# Patient Record
Sex: Female | Born: 1935 | Race: White | Hispanic: No
Health system: Southern US, Community
[De-identification: ages and names within clinical notes are randomized; demographics above are authoritative.]

---

## 1997-10-04 ENCOUNTER — Ambulatory Visit (HOSPITAL_COMMUNITY): Admission: RE | Admit: 1997-10-04 | Discharge: 1997-10-04 | Payer: Self-pay

## 1998-10-09 ENCOUNTER — Ambulatory Visit (HOSPITAL_COMMUNITY): Admission: RE | Admit: 1998-10-09 | Discharge: 1998-10-09 | Payer: Self-pay

## 2000-10-15 ENCOUNTER — Ambulatory Visit (HOSPITAL_COMMUNITY): Admission: RE | Admit: 2000-10-15 | Discharge: 2000-10-15 | Payer: Self-pay

## 2001-10-19 ENCOUNTER — Ambulatory Visit (HOSPITAL_COMMUNITY): Admission: RE | Admit: 2001-10-19 | Discharge: 2001-10-19 | Payer: Self-pay

## 2002-10-26 ENCOUNTER — Ambulatory Visit (HOSPITAL_COMMUNITY): Admission: RE | Admit: 2002-10-26 | Discharge: 2002-10-26 | Payer: Self-pay | Admitting: *Deleted

## 2004-12-29 ENCOUNTER — Emergency Department: Payer: Self-pay | Admitting: General Practice

## 2005-03-13 ENCOUNTER — Ambulatory Visit: Payer: Self-pay | Admitting: Internal Medicine

## 2005-06-29 ENCOUNTER — Emergency Department: Payer: Self-pay | Admitting: Internal Medicine

## 2005-06-29 ENCOUNTER — Other Ambulatory Visit: Payer: Self-pay

## 2005-09-11 ENCOUNTER — Ambulatory Visit: Payer: Self-pay | Admitting: Internal Medicine

## 2005-10-13 ENCOUNTER — Ambulatory Visit: Payer: Self-pay | Admitting: Internal Medicine

## 2006-07-28 ENCOUNTER — Ambulatory Visit: Payer: Self-pay | Admitting: Internal Medicine

## 2006-08-04 ENCOUNTER — Inpatient Hospital Stay: Payer: Self-pay | Admitting: Internal Medicine

## 2006-12-17 IMAGING — RF DG UGI W/O KUB
2 series · 15 of 20 positions shown · non-contrast
Comparison: none

REASON FOR EXAM: GERD, reflux
COMMENTS:

[Series 1: run · 14 of 19 slices shown]
[im 1/19]
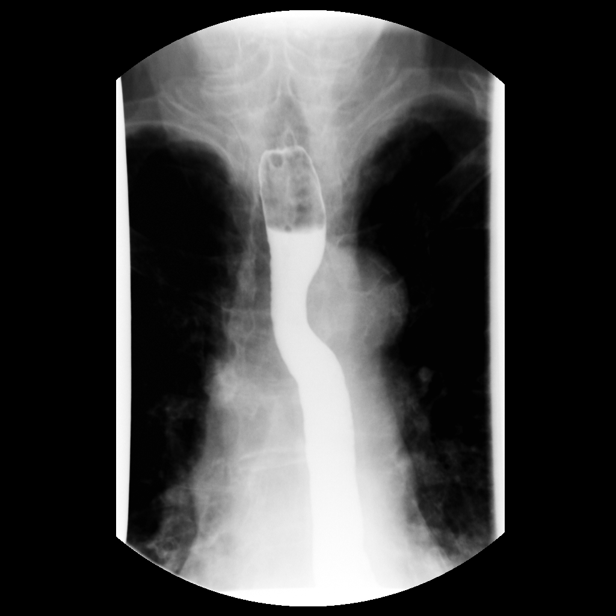
[im 3/19]
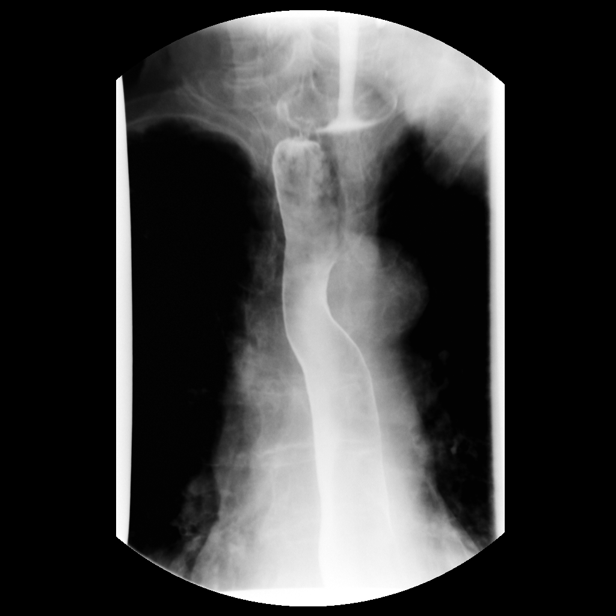
[im 4/19]
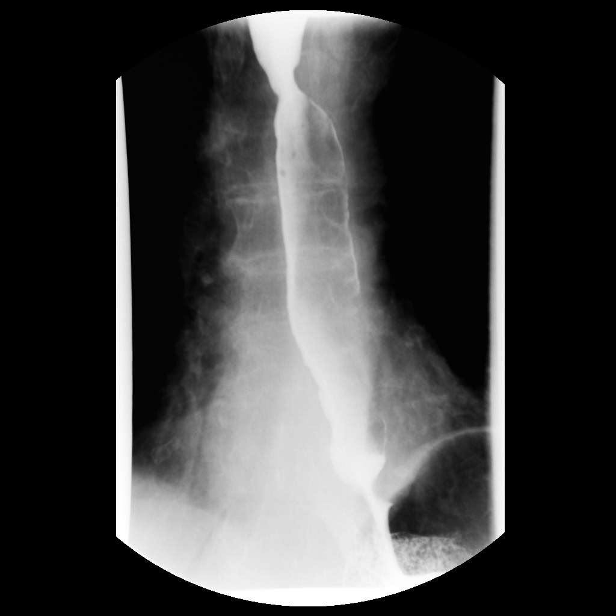
[im 5/19]
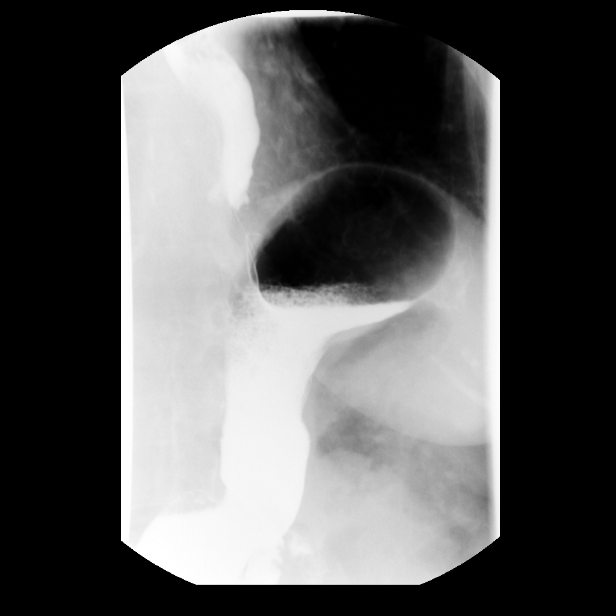
[im 7/19]
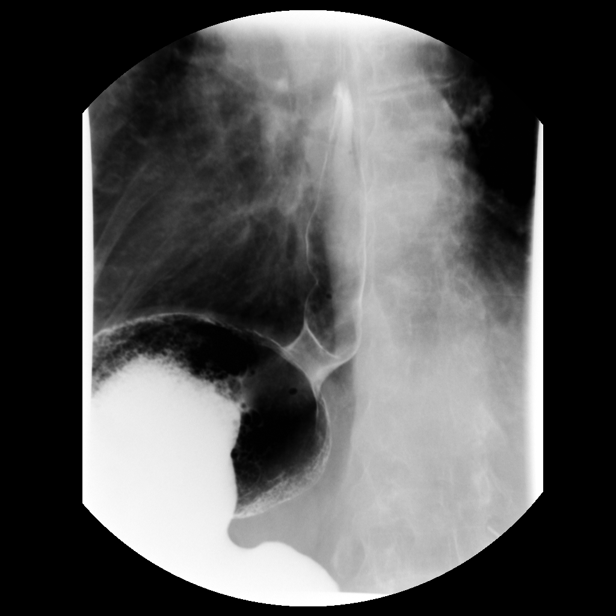
[im 8/19]
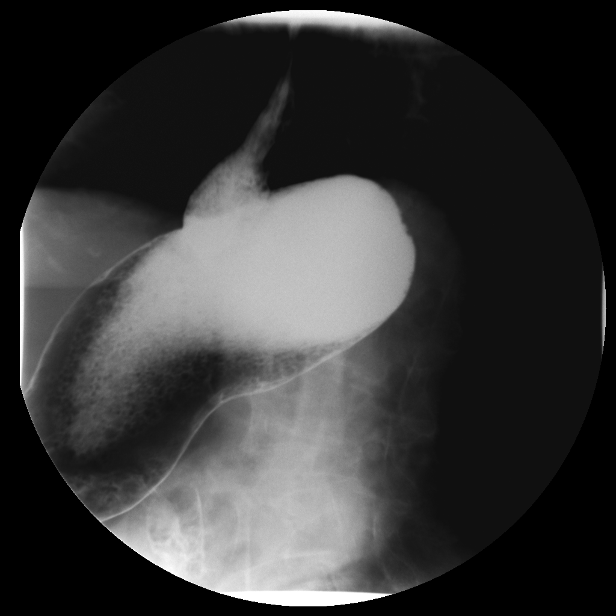
[im 9/19]
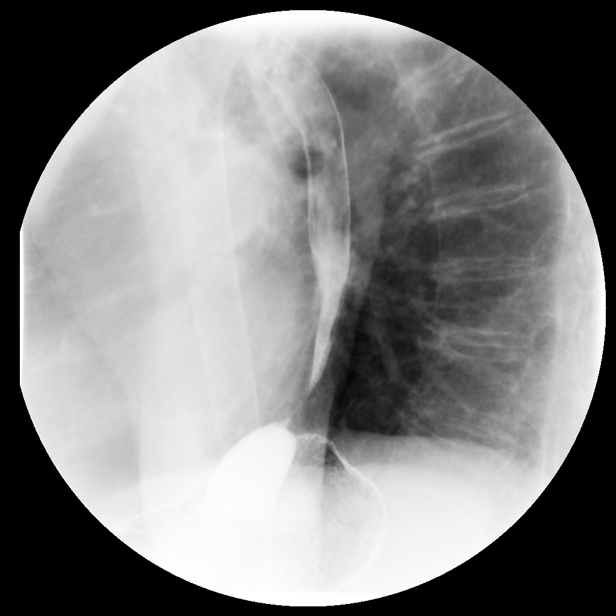
[im 11/19]
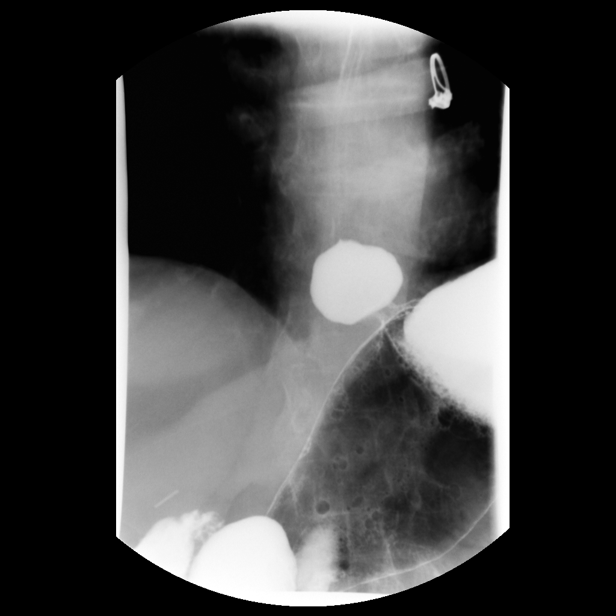
[im 12/19]
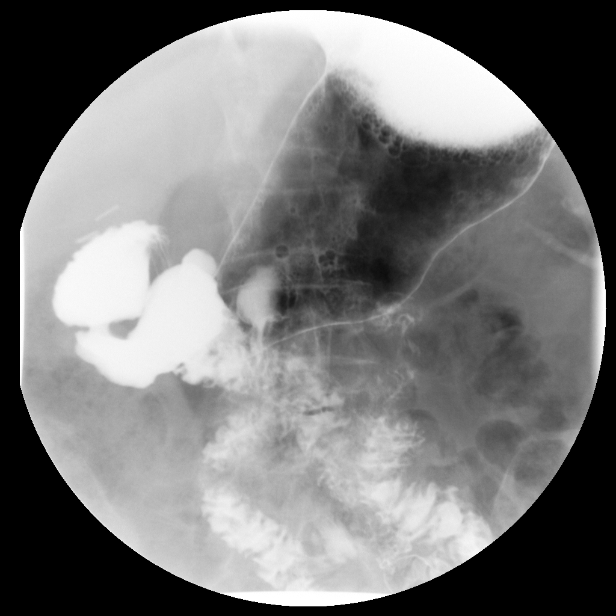
[im 13/19]
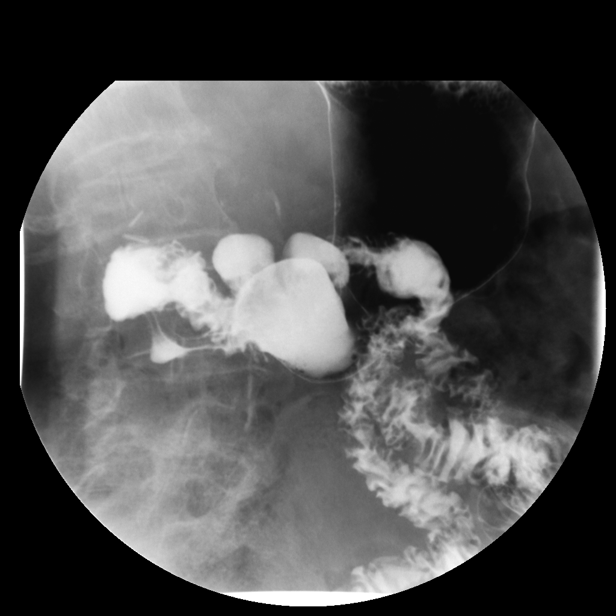
[im 15/19]
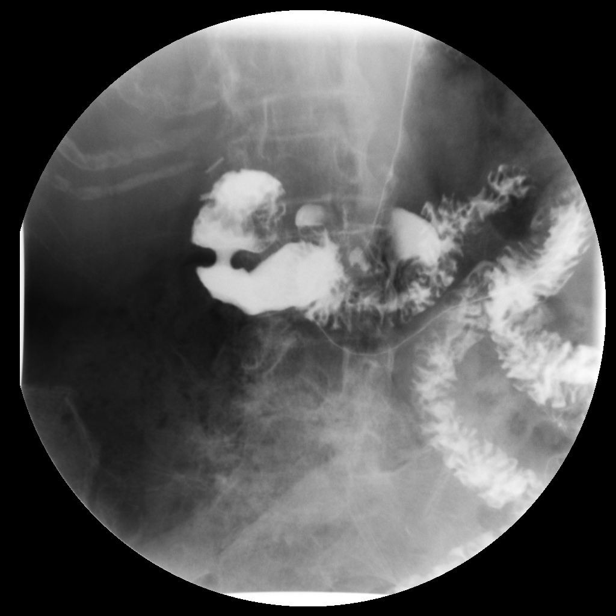
[im 16/19]
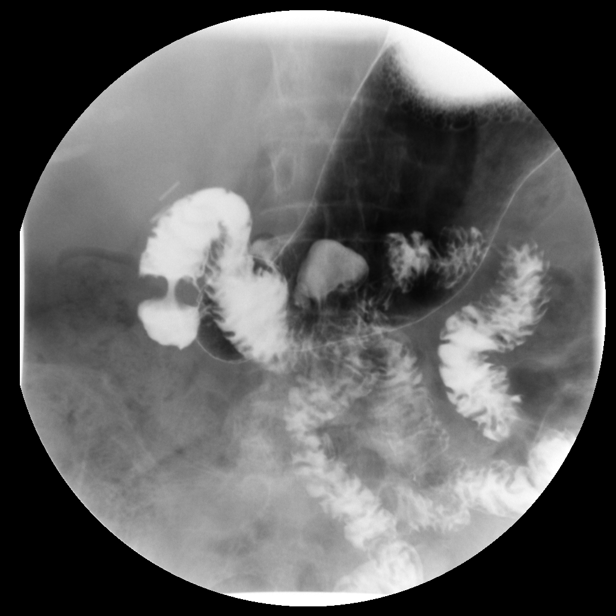
[im 17/19]
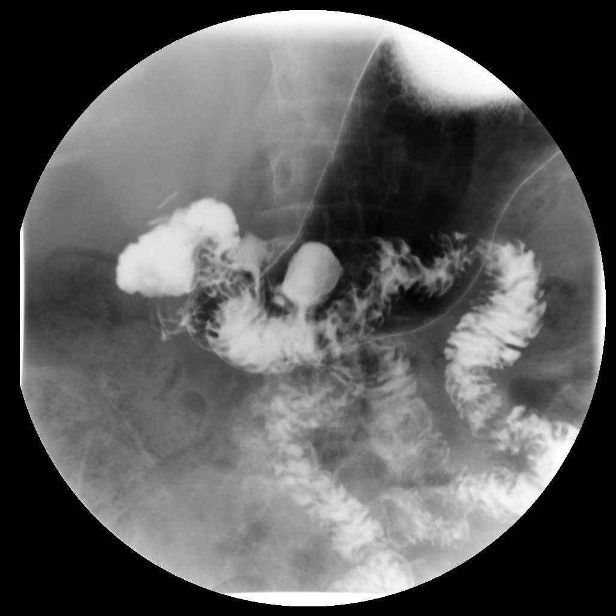
[im 19/19]
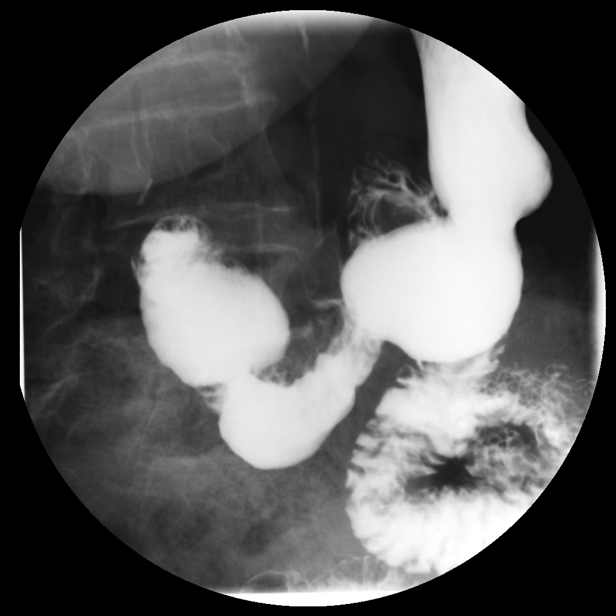

[Series 1: view not recorded · 0.17mm/px · 1 of 1 slices shown]
[im 1/1]
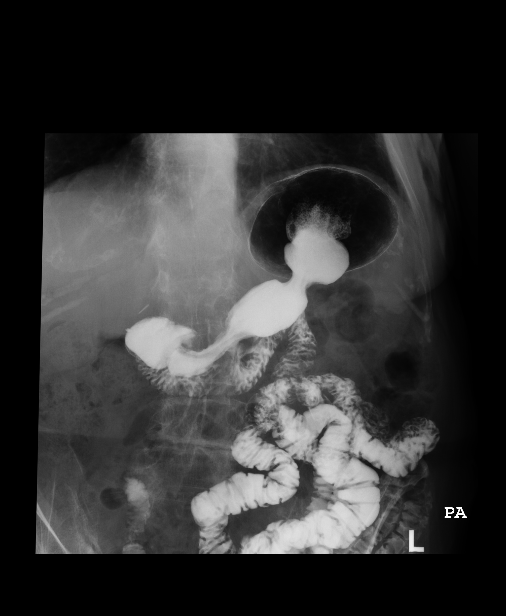

[15 of 20 positions shown; findings below may reference images not displayed]

PROCEDURE:     FL  - FL UPPER GI  - September 11, 2005 [DATE]

RESULT:     Double contrast Upper GI examination demonstrates the patient
easily ingests the liquid barium. An intermittent, sliding type, hiatal
hernia with gastroesophageal reflux is noted. The esophageal mucosa appears
to be normal. The gastric mucosa is also normal. Duodenal diverticula are
seen. The study shows no evidence of ulceration. The proximal small bowel is
otherwise normal. Cholecystectomy clips are noted.
IMPRESSION: 1.     Hiatal hernia with gastroesophageal reflux to the mid esophagus.
2.     Incidental finding of duodenal diverticula.

## 2007-10-25 ENCOUNTER — Ambulatory Visit: Payer: Self-pay | Admitting: Family Medicine

## 2008-06-27 ENCOUNTER — Ambulatory Visit: Payer: Self-pay

## 2011-05-01 ENCOUNTER — Emergency Department: Payer: Self-pay | Admitting: Emergency Medicine

## 2011-06-05 ENCOUNTER — Emergency Department: Payer: Self-pay | Admitting: Emergency Medicine

## 2011-06-21 ENCOUNTER — Emergency Department: Payer: Self-pay | Admitting: Emergency Medicine

## 2011-10-29 ENCOUNTER — Emergency Department: Payer: Self-pay | Admitting: *Deleted

## 2013-03-11 LAB — BASIC METABOLIC PANEL
Anion Gap: 10 (ref 7–16)
BUN: 13 mg/dL (ref 7–18)
Calcium, Total: 8.4 mg/dL — ABNORMAL LOW (ref 8.5–10.1)
Chloride: 79 mmol/L — ABNORMAL LOW (ref 98–107)
Co2: 26 mmol/L (ref 21–32)
Creatinine: 0.62 mg/dL (ref 0.60–1.30)
EGFR (African American): >60
EGFR (Non-African Amer.): >60
Glucose: 171 mg/dL — ABNORMAL HIGH (ref 65–99)
Osmolality: 237 (ref 275–301)
Potassium: 5.4 mmol/L — ABNORMAL HIGH (ref 3.5–5.1)
Sodium: 115 mmol/L — CL (ref 136–145)

## 2013-03-11 LAB — CK TOTAL AND CKMB (NOT AT ARMC)
CK, Total: 209 U/L (ref 21–215)
CK-MB: 4 ng/mL — ABNORMAL HIGH (ref 0.5–3.6)

## 2013-03-11 LAB — CBC
HCT: 38 % (ref 35.0–47.0)
HGB: 13.1 g/dL (ref 12.0–16.0)
MCH: 29.7 pg (ref 26.0–34.0)
MCHC: 34.5 g/dL (ref 32.0–36.0)
MCV: 86 fL (ref 80–100)
Platelet: 283 10*3/uL (ref 150–440)
RBC: 4.41 10*6/uL (ref 3.80–5.20)
RDW: 13.9 % (ref 11.5–14.5)
WBC: 5.6 10*3/uL (ref 3.6–11.0)

## 2013-03-11 LAB — TROPONIN I: Troponin-I: <0.02 ng/mL

## 2013-03-12 ENCOUNTER — Inpatient Hospital Stay: Payer: Self-pay | Admitting: Internal Medicine

## 2013-03-12 LAB — BASIC METABOLIC PANEL WITH GFR
Anion Gap: 12
BUN: 14 mg/dL
Calcium, Total: 8.2 mg/dL — ABNORMAL LOW
Chloride: 81 mmol/L — ABNORMAL LOW
Co2: 24 mmol/L
Creatinine: 0.79 mg/dL
EGFR (African American): >60
EGFR (Non-African Amer.): >60
Glucose: 109 mg/dL — ABNORMAL HIGH
Osmolality: 238
Potassium: 4.8 mmol/L
Sodium: 117 mmol/L — CL

## 2013-03-12 LAB — URINALYSIS, COMPLETE
Bilirubin,UR: NEGATIVE
Blood: NEGATIVE
Glucose,UR: NEGATIVE mg/dL (ref 0–75)
Leukocyte Esterase: NEGATIVE
Nitrite: NEGATIVE
Ph: 5 (ref 4.5–8.0)
Protein: 30
RBC,UR: 2 /HPF (ref 0–5)
Specific Gravity: 1.023 (ref 1.003–1.030)
Squamous Epithelial: <1
WBC UR: <1 /HPF (ref 0–5)

## 2013-03-12 LAB — SODIUM, URINE, RANDOM: Sodium, Urine Random: 99 mmol/L (ref 20–110)

## 2013-03-12 LAB — TSH: Thyroid Stimulating Horm: 1.22 u[IU]/mL

## 2013-03-13 LAB — CBC WITH DIFFERENTIAL/PLATELET
Eosinophil %: 0 %
HCT: 37.4 % (ref 35.0–47.0)
Lymphocyte #: 0.5 10*3/uL — ABNORMAL LOW (ref 1.0–3.6)
MCHC: 34 g/dL (ref 32.0–36.0)
Monocyte #: 0.7 x10 3/mm (ref 0.2–0.9)
Monocyte %: 7.1 %
Neutrophil #: 8.8 10*3/uL — ABNORMAL HIGH (ref 1.4–6.5)
RDW: 14.1 % (ref 11.5–14.5)
WBC: 10.1 10*3/uL (ref 3.6–11.0)

## 2013-03-13 LAB — BASIC METABOLIC PANEL
Anion Gap: 9 (ref 7–16)
Calcium, Total: 8 mg/dL — ABNORMAL LOW (ref 8.5–10.1)
Chloride: 91 mmol/L — ABNORMAL LOW (ref 98–107)
Sodium: 126 mmol/L — ABNORMAL LOW (ref 136–145)

## 2013-03-14 LAB — URINALYSIS, COMPLETE
Bilirubin,UR: NEGATIVE
Blood: NEGATIVE
Leukocyte Esterase: NEGATIVE
Nitrite: NEGATIVE
Ph: 6 (ref 4.5–8.0)
Protein: NEGATIVE
RBC,UR: 1 /HPF (ref 0–5)
Specific Gravity: 1.012 (ref 1.003–1.030)
WBC UR: 1 /HPF (ref 0–5)

## 2013-03-14 LAB — HEPATIC FUNCTION PANEL A (ARMC)
Albumin: 2.7 g/dL — ABNORMAL LOW (ref 3.4–5.0)
Alkaline Phosphatase: 79 U/L (ref 50–136)
Bilirubin,Total: 0.5 mg/dL (ref 0.2–1.0)
SGOT(AST): 43 U/L — ABNORMAL HIGH (ref 15–37)
SGPT (ALT): 28 U/L (ref 12–78)
Total Protein: 5.3 g/dL — ABNORMAL LOW (ref 6.4–8.2)

## 2013-03-14 LAB — MAGNESIUM: Magnesium: 1.7 mg/dL — ABNORMAL LOW

## 2013-03-14 LAB — BASIC METABOLIC PANEL
BUN: 7 mg/dL (ref 7–18)
Calcium, Total: 7.9 mg/dL — ABNORMAL LOW (ref 8.5–10.1)
Chloride: 95 mmol/L — ABNORMAL LOW (ref 98–107)
Co2: 29 mmol/L (ref 21–32)
EGFR (Non-African Amer.): >60
Potassium: 3 mmol/L — ABNORMAL LOW (ref 3.5–5.1)

## 2013-03-15 LAB — BASIC METABOLIC PANEL
Anion Gap: 7 (ref 7–16)
BUN: 4 mg/dL — ABNORMAL LOW (ref 7–18)
Calcium, Total: 8.3 mg/dL — ABNORMAL LOW (ref 8.5–10.1)
Chloride: 94 mmol/L — ABNORMAL LOW (ref 98–107)
Co2: 29 mmol/L (ref 21–32)
EGFR (Non-African Amer.): >60
Sodium: 130 mmol/L — ABNORMAL LOW (ref 136–145)

## 2013-03-15 LAB — MAGNESIUM: Magnesium: 1.9 mg/dL

## 2013-03-15 LAB — OSMOLALITY: Osmolality: 247 mOsm/kg — CL (ref 280–301)

## 2013-03-16 LAB — BASIC METABOLIC PANEL
Anion Gap: 6 — ABNORMAL LOW (ref 7–16)
Calcium, Total: 8.4 mg/dL — ABNORMAL LOW (ref 8.5–10.1)
Chloride: 95 mmol/L — ABNORMAL LOW (ref 98–107)
Co2: 32 mmol/L (ref 21–32)
Creatinine: 0.5 mg/dL — ABNORMAL LOW (ref 0.60–1.30)
EGFR (Non-African Amer.): >60
Glucose: 98 mg/dL (ref 65–99)
Sodium: 133 mmol/L — ABNORMAL LOW (ref 136–145)

## 2013-05-19 ENCOUNTER — Inpatient Hospital Stay: Payer: Self-pay | Admitting: Internal Medicine

## 2013-05-19 LAB — COMPREHENSIVE METABOLIC PANEL
Albumin: 3.5 g/dL (ref 3.4–5.0)
Anion Gap: 3 — ABNORMAL LOW (ref 7–16)
BUN: 30 mg/dL — ABNORMAL HIGH (ref 7–18)
Bilirubin,Total: 0.5 mg/dL (ref 0.2–1.0)
Calcium, Total: 9.4 mg/dL (ref 8.5–10.1)
Co2: 33 mmol/L — ABNORMAL HIGH (ref 21–32)
Creatinine: 1.06 mg/dL (ref 0.60–1.30)
EGFR (Non-African Amer.): 45 — ABNORMAL LOW
Glucose: 99 mg/dL (ref 65–99)
Osmolality: 241 (ref 275–301)
SGPT (ALT): 17 U/L (ref 12–78)
Sodium: 116 mmol/L — CL (ref 136–145)
Total Protein: 7.2 g/dL (ref 6.4–8.2)

## 2013-05-19 LAB — URINALYSIS, COMPLETE
Bilirubin,UR: NEGATIVE
Blood: NEGATIVE
Glucose,UR: NEGATIVE mg/dL (ref 0–75)
Leukocyte Esterase: NEGATIVE
Nitrite: NEGATIVE
Ph: 5 (ref 4.5–8.0)
Protein: 30
RBC,UR: <1 /HPF (ref 0–5)
Specific Gravity: 1.016 (ref 1.003–1.030)
WBC UR: 5 /HPF (ref 0–5)

## 2013-05-19 LAB — CBC
HCT: 41.4 % (ref 35.0–47.0)
HGB: 14.4 g/dL (ref 12.0–16.0)
MCHC: 34.8 g/dL (ref 32.0–36.0)
RBC: 4.57 10*6/uL (ref 3.80–5.20)

## 2013-05-19 LAB — MAGNESIUM: Magnesium: 1.9 mg/dL

## 2013-05-19 LAB — SODIUM: Sodium: 119 mmol/L — CL (ref 136–145)

## 2013-05-20 LAB — CBC WITH DIFFERENTIAL/PLATELET
Basophil #: 0 10*3/uL (ref 0.0–0.1)
Basophil %: 0.2 %
Eosinophil #: 0.1 10*3/uL (ref 0.0–0.7)
Eosinophil %: 0.9 %
HCT: 33.7 % — ABNORMAL LOW (ref 35.0–47.0)
HGB: 11.8 g/dL — ABNORMAL LOW (ref 12.0–16.0)
Lymphocyte #: 0.9 10*3/uL — ABNORMAL LOW (ref 1.0–3.6)
MCH: 31.5 pg (ref 26.0–34.0)
MCHC: 35.1 g/dL (ref 32.0–36.0)
Monocyte %: 11.5 %
Neutrophil #: 6.4 10*3/uL (ref 1.4–6.5)
Platelet: 240 10*3/uL (ref 150–440)
RBC: 3.76 10*6/uL — ABNORMAL LOW (ref 3.80–5.20)
RDW: 15.9 % — ABNORMAL HIGH (ref 11.5–14.5)
WBC: 8.3 10*3/uL (ref 3.6–11.0)

## 2013-05-20 LAB — BASIC METABOLIC PANEL
Anion Gap: 8 (ref 7–16)
Co2: 29 mmol/L (ref 21–32)
Creatinine: 0.86 mg/dL (ref 0.60–1.30)
Osmolality: 251 (ref 275–301)
Potassium: 4.3 mmol/L (ref 3.5–5.1)
Sodium: 122 mmol/L — ABNORMAL LOW (ref 136–145)

## 2013-05-20 LAB — TSH: Thyroid Stimulating Horm: 1.97 u[IU]/mL

## 2013-05-21 LAB — BASIC METABOLIC PANEL
Anion Gap: 2 — ABNORMAL LOW (ref 7–16)
BUN: 19 mg/dL — ABNORMAL HIGH (ref 7–18)
Calcium, Total: 9 mg/dL (ref 8.5–10.1)
Chloride: 90 mmol/L — ABNORMAL LOW (ref 98–107)
Co2: 33 mmol/L — ABNORMAL HIGH (ref 21–32)
Creatinine: 0.69 mg/dL (ref 0.60–1.30)
Glucose: 86 mg/dL (ref 65–99)
Osmolality: 253 (ref 275–301)
Potassium: 4.5 mmol/L (ref 3.5–5.1)

## 2013-05-21 LAB — CBC WITH DIFFERENTIAL/PLATELET
Bands: 1 %
Basophil: 1 %
Eosinophil: 3 %
HCT: 34 % — ABNORMAL LOW (ref 35.0–47.0)
HGB: 11.7 g/dL — ABNORMAL LOW (ref 12.0–16.0)
Lymphocytes: 15 %
MCHC: 34.3 g/dL (ref 32.0–36.0)
Monocytes: 8 %
Myelocyte: 1 %
Platelet: 232 10*3/uL (ref 150–440)
Segmented Neutrophils: 71 %
WBC: 7.3 10*3/uL (ref 3.6–11.0)

## 2013-05-22 LAB — SODIUM: Sodium: 126 mmol/L — ABNORMAL LOW (ref 136–145)

## 2013-05-23 ENCOUNTER — Encounter: Payer: Self-pay | Admitting: Internal Medicine

## 2013-05-26 LAB — BASIC METABOLIC PANEL
Anion Gap: 4 — ABNORMAL LOW (ref 7–16)
BUN: 25 mg/dL — ABNORMAL HIGH (ref 7–18)
Calcium, Total: 8.8 mg/dL (ref 8.5–10.1)
Chloride: 93 mmol/L — ABNORMAL LOW (ref 98–107)
Co2: 31 mmol/L (ref 21–32)
Creatinine: 0.69 mg/dL (ref 0.60–1.30)
EGFR (African American): >60
Glucose: 92 mg/dL (ref 65–99)
Osmolality: 261 (ref 275–301)
Potassium: 4.5 mmol/L (ref 3.5–5.1)
Sodium: 128 mmol/L — ABNORMAL LOW (ref 136–145)

## 2013-05-31 LAB — BASIC METABOLIC PANEL
Calcium, Total: 8.8 mg/dL (ref 8.5–10.1)
Chloride: 91 mmol/L — ABNORMAL LOW (ref 98–107)
Creatinine: 0.81 mg/dL (ref 0.60–1.30)
EGFR (African American): >60
EGFR (Non-African Amer.): >60
Osmolality: 260 (ref 275–301)
Sodium: 126 mmol/L — ABNORMAL LOW (ref 136–145)

## 2013-06-02 LAB — BASIC METABOLIC PANEL
Anion Gap: 4 — ABNORMAL LOW (ref 7–16)
BUN: 21 mg/dL — ABNORMAL HIGH (ref 7–18)
Calcium, Total: 8.9 mg/dL (ref 8.5–10.1)
Creatinine: 0.71 mg/dL (ref 0.60–1.30)
Potassium: 4.2 mmol/L (ref 3.5–5.1)

## 2013-06-07 LAB — BASIC METABOLIC PANEL
BUN: 17 mg/dL (ref 7–18)
Chloride: 92 mmol/L — ABNORMAL LOW (ref 98–107)
Creatinine: 0.8 mg/dL (ref 0.60–1.30)
EGFR (African American): >60
EGFR (Non-African Amer.): >60
Osmolality: 257 (ref 275–301)
Potassium: 4.2 mmol/L (ref 3.5–5.1)
Sodium: 127 mmol/L — ABNORMAL LOW (ref 136–145)

## 2013-06-13 ENCOUNTER — Ambulatory Visit: Payer: Self-pay | Admitting: Nurse Practitioner

## 2013-06-13 ENCOUNTER — Encounter: Payer: Self-pay | Admitting: Internal Medicine

## 2013-07-04 ENCOUNTER — Inpatient Hospital Stay: Payer: Self-pay | Admitting: Internal Medicine

## 2013-07-04 LAB — CBC
HCT: 38.9 % (ref 35.0–47.0)
HGB: 12.8 g/dL (ref 12.0–16.0)
MCH: 30.7 pg (ref 26.0–34.0)
MCHC: 33 g/dL (ref 32.0–36.0)
MCV: 93 fL (ref 80–100)
RBC: 4.17 10*6/uL (ref 3.80–5.20)

## 2013-07-04 LAB — URINALYSIS, COMPLETE
Bacteria: NONE SEEN
Bilirubin,UR: NEGATIVE
Glucose,UR: NEGATIVE mg/dL (ref 0–75)
Nitrite: NEGATIVE
RBC,UR: NONE SEEN /HPF (ref 0–5)
Squamous Epithelial: NONE SEEN
WBC UR: <1 /HPF (ref 0–5)

## 2013-07-04 LAB — COMPREHENSIVE METABOLIC PANEL
Albumin: 3.3 g/dL — ABNORMAL LOW (ref 3.4–5.0)
Bilirubin,Total: 0.3 mg/dL (ref 0.2–1.0)
Co2: 28 mmol/L (ref 21–32)
EGFR (African American): >60
EGFR (Non-African Amer.): >60
Glucose: 117 mg/dL — ABNORMAL HIGH (ref 65–99)
Osmolality: 262 (ref 275–301)
Potassium: 4 mmol/L (ref 3.5–5.1)
SGPT (ALT): 18 U/L (ref 12–78)
Total Protein: 6.5 g/dL (ref 6.4–8.2)

## 2013-07-04 LAB — CK TOTAL AND CKMB (NOT AT ARMC)
CK, Total: 62 U/L (ref 21–215)
CK-MB: 2.7 ng/mL (ref 0.5–3.6)

## 2013-07-04 LAB — APTT: Activated PTT: 26.8 secs (ref 23.6–35.9)

## 2013-07-04 LAB — PROTIME-INR: INR: 1

## 2013-07-05 LAB — CBC WITH DIFFERENTIAL/PLATELET
Basophil #: 0 10*3/uL (ref 0.0–0.1)
Basophil %: 0.3 %
Eosinophil #: 0.1 10*3/uL (ref 0.0–0.7)
Eosinophil %: 1.8 %
HCT: 32.3 % — ABNORMAL LOW (ref 35.0–47.0)
Lymphocyte #: 0.8 10*3/uL — ABNORMAL LOW (ref 1.0–3.6)
MCH: 31.1 pg (ref 26.0–34.0)
MCV: 94 fL (ref 80–100)
Monocyte %: 9.5 %
Neutrophil #: 5.3 10*3/uL (ref 1.4–6.5)
Neutrophil %: 76.2 %
RBC: 3.44 10*6/uL — ABNORMAL LOW (ref 3.80–5.20)
RDW: 14.5 % (ref 11.5–14.5)

## 2013-07-05 LAB — BASIC METABOLIC PANEL
Anion Gap: 4 — ABNORMAL LOW (ref 7–16)
Chloride: 99 mmol/L (ref 98–107)
Co2: 29 mmol/L (ref 21–32)
Creatinine: 0.76 mg/dL (ref 0.60–1.30)
EGFR (African American): >60
EGFR (Non-African Amer.): >60
Glucose: 95 mg/dL (ref 65–99)
Osmolality: 264 (ref 275–301)
Potassium: 4.7 mmol/L (ref 3.5–5.1)

## 2013-07-06 LAB — BASIC METABOLIC PANEL
Anion Gap: 5 — ABNORMAL LOW (ref 7–16)
BUN: 16 mg/dL (ref 7–18)
Calcium, Total: 7.5 mg/dL — ABNORMAL LOW (ref 8.5–10.1)
Chloride: 103 mmol/L (ref 98–107)
Creatinine: 0.89 mg/dL (ref 0.60–1.30)
Glucose: 101 mg/dL — ABNORMAL HIGH (ref 65–99)

## 2013-07-06 LAB — PLATELET COUNT: Platelet: 194 10*3/uL (ref 150–440)

## 2013-07-06 LAB — HEMOGLOBIN: HGB: 9.2 g/dL — ABNORMAL LOW (ref 12.0–16.0)

## 2013-07-07 LAB — BASIC METABOLIC PANEL
Anion Gap: 9 (ref 7–16)
Calcium, Total: 8.6 mg/dL (ref 8.5–10.1)
EGFR (Non-African Amer.): 34 — ABNORMAL LOW
Osmolality: 273 (ref 275–301)
Sodium: 133 mmol/L — ABNORMAL LOW (ref 136–145)

## 2013-07-07 LAB — PLATELET COUNT: Platelet: 204 10*3/uL (ref 150–440)

## 2013-07-07 LAB — HEMOGLOBIN: HGB: 9.8 g/dL — ABNORMAL LOW (ref 12.0–16.0)

## 2013-07-08 ENCOUNTER — Encounter: Payer: Self-pay | Admitting: Internal Medicine

## 2013-07-08 LAB — BASIC METABOLIC PANEL
BUN: 28 mg/dL — ABNORMAL HIGH (ref 7–18)
Calcium, Total: 8.1 mg/dL — ABNORMAL LOW (ref 8.5–10.1)
Chloride: 102 mmol/L (ref 98–107)
Co2: 26 mmol/L (ref 21–32)
Osmolality: 272 (ref 275–301)
Potassium: 4.4 mmol/L (ref 3.5–5.1)
Sodium: 133 mmol/L — ABNORMAL LOW (ref 136–145)

## 2013-07-08 LAB — CREATININE, SERUM
Creatinine: 0.93 mg/dL (ref 0.60–1.30)
EGFR (African American): >60
EGFR (Non-African Amer.): 53 — ABNORMAL LOW

## 2013-07-12 LAB — SODIUM: Sodium: 126 mmol/L — ABNORMAL LOW (ref 136–145)

## 2013-07-12 LAB — CREATININE, SERUM
EGFR (African American): >60
EGFR (Non-African Amer.): >60

## 2013-07-14 ENCOUNTER — Encounter: Payer: Self-pay | Admitting: Internal Medicine

## 2013-07-14 ENCOUNTER — Ambulatory Visit: Payer: Self-pay | Admitting: Nurse Practitioner

## 2013-08-14 DEATH — deceased

## 2014-08-24 IMAGING — CR DG LUMBAR SPINE 2-3V
1 series · 4 of 4 positions shown · non-contrast
Comparison: CT abdomen July 2006. Thoracic spine CT 05/01/2011.

CLINICAL DATA: Fall. Back pain.

EXAM:
LUMBAR SPINE - 2-3 VIEW

[Series 1: t lumbar spine ap · 0.14mm/px · 4 of 4 slices shown]
[im 1/4]
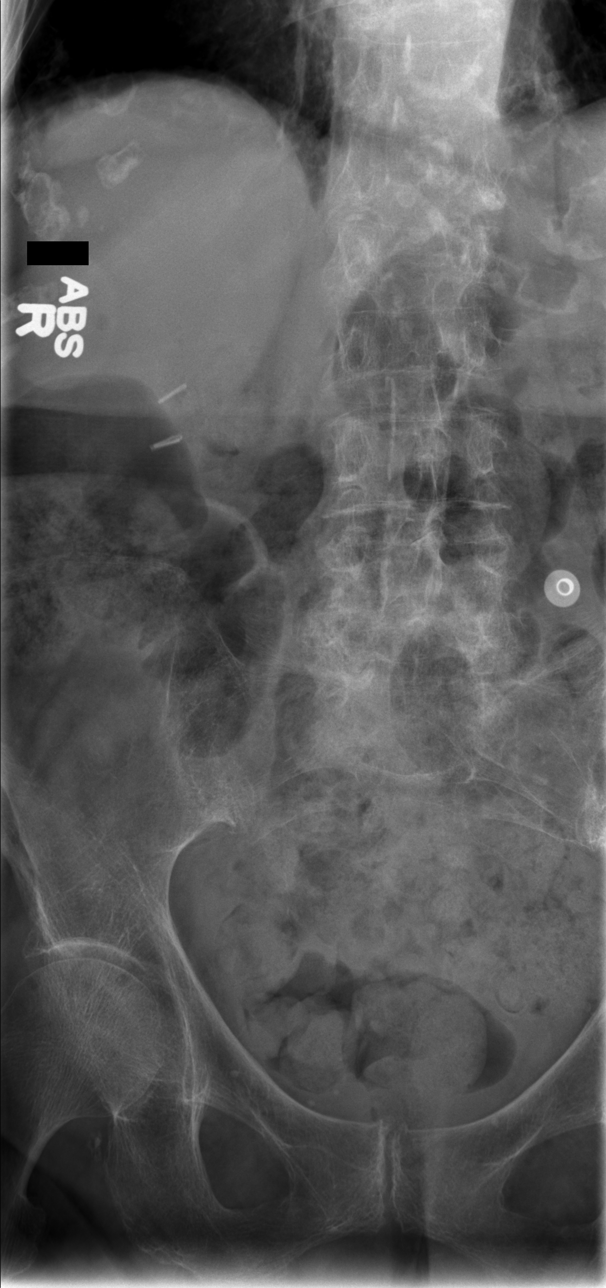
[im 2/4]
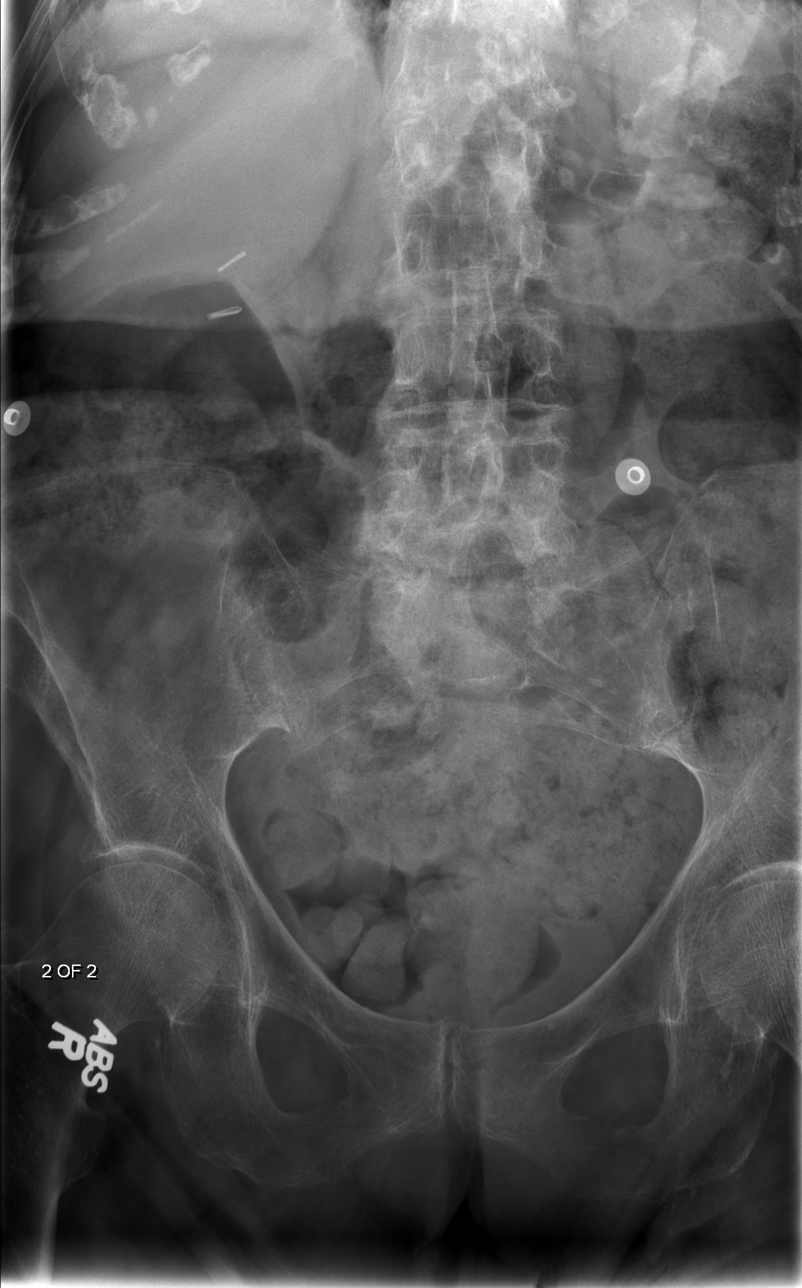
[im 3/4]
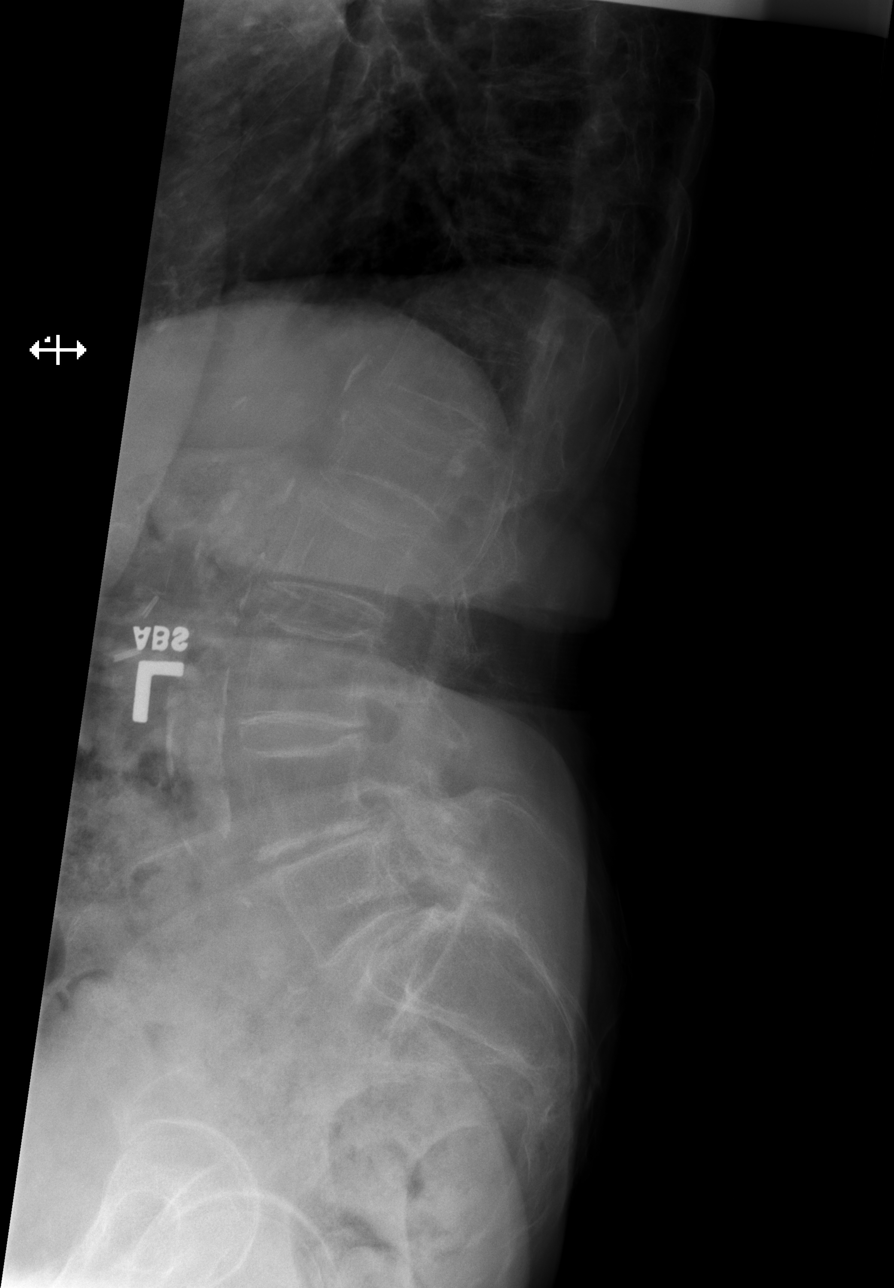
[im 4/4]
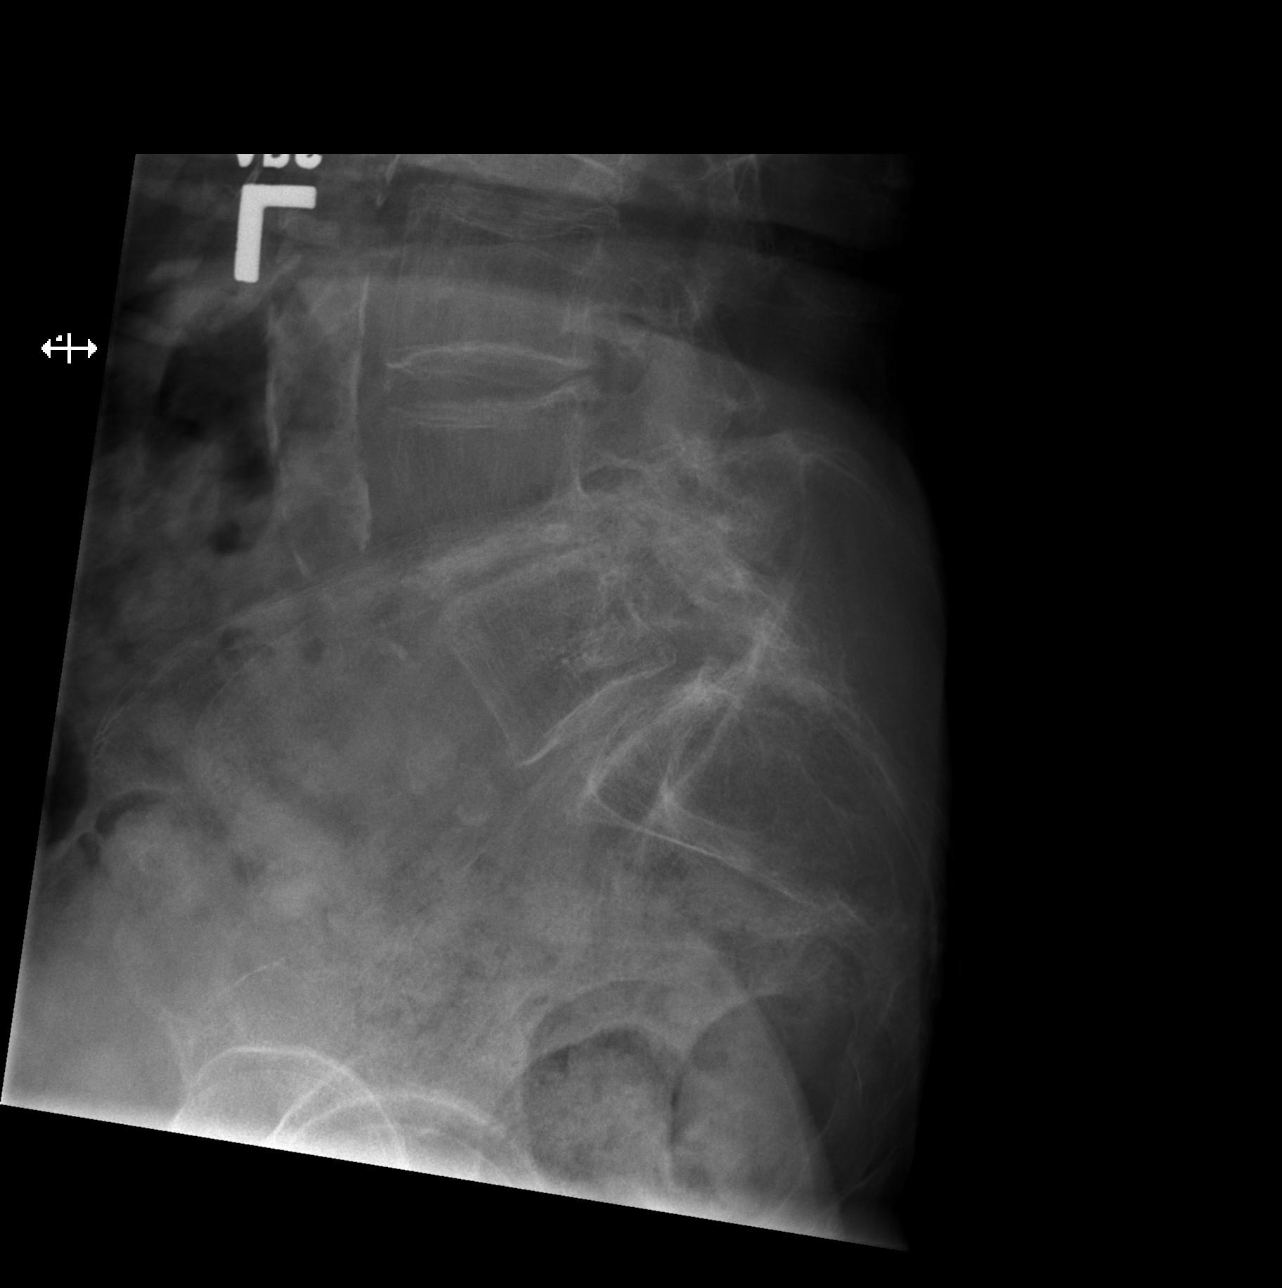

[4 of 4 positions shown; findings below may reference images not displayed]

FINDINGS: There is loss of height at the L1 vertebral body estimated at 25%.
This was not present in 9225 or 5575 and could be acute or subacute.
No apparent retropulsion. Chronic degenerative disk disease and
degenerative facet disease in the lower lumbar spine is noted.
IMPRESSION: Compression fracture at L1 with loss of height of about 25%. This is
new since 05/01/2011.

## 2014-08-24 IMAGING — CT CT HEAD WITHOUT CONTRAST
1 series · 16 of 30 positions shown, 20 images · non-contrast
Comparison: None

CLINICAL DATA: Head injury, fall

EXAM:
CT HEAD WITHOUT CONTRAST
TECHNIQUE: Contiguous axial images were obtained from the base of the skull
through the vertex without intravenous contrast.

[Series 2: head wo · axial · 0.41mm/px · z∈[+1152,+1278]mm · 16 of 32 slices shown, 20 images]
[im 2/32  brain]
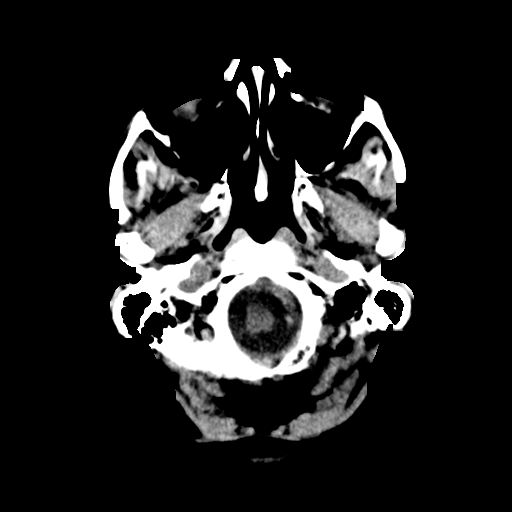
[im 2/32  bone]
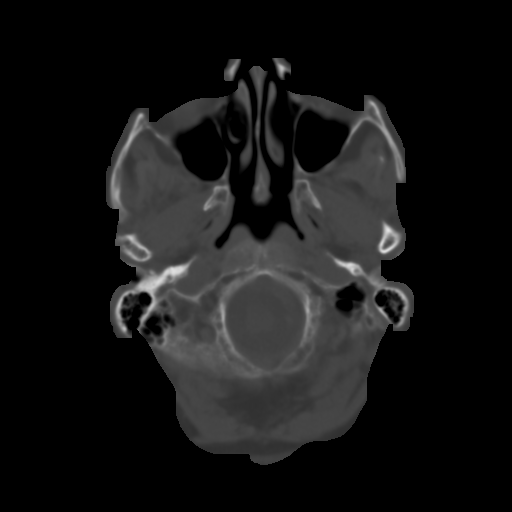
[im 4/32  brain]
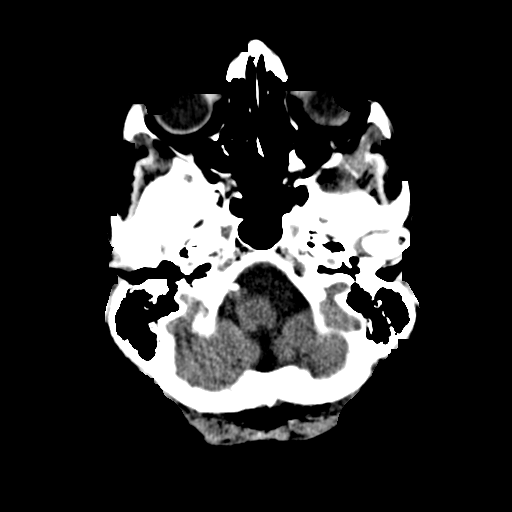
[im 6/32  brain]
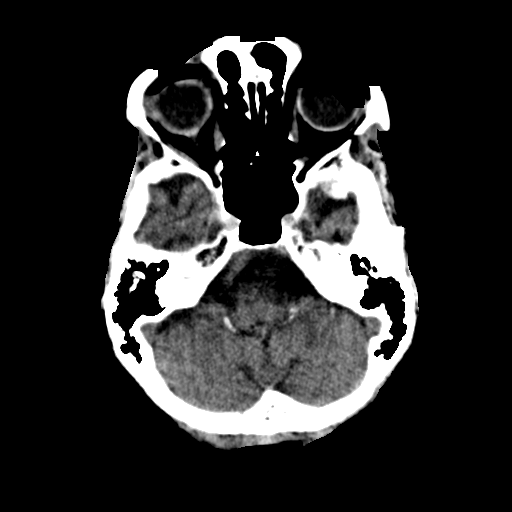
[im 8/32  brain]
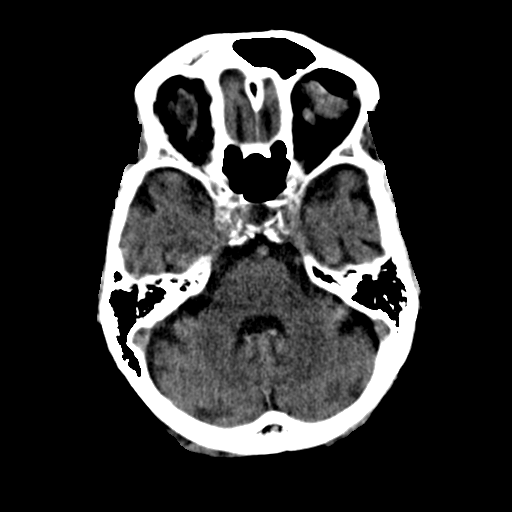
[im 9/32  brain]
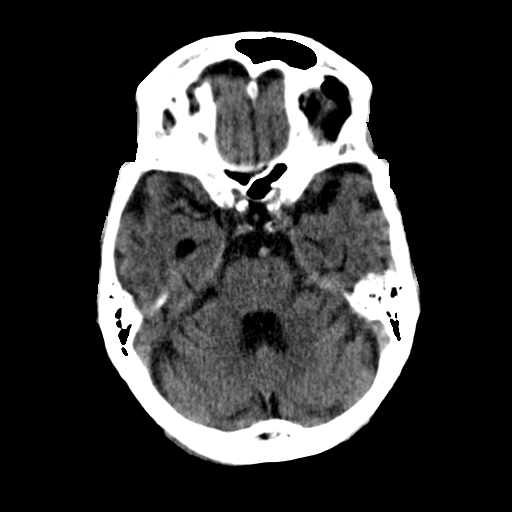
[im 9/32  bone]
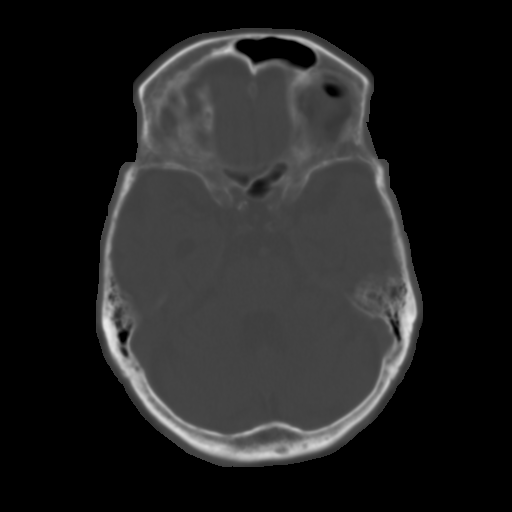
[im 11/32  brain]
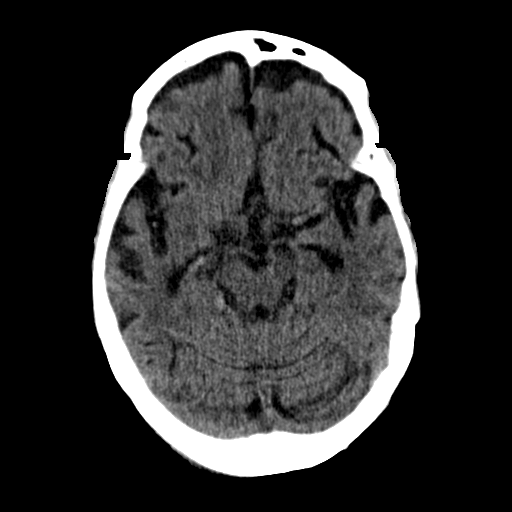
[im 13/32  brain]
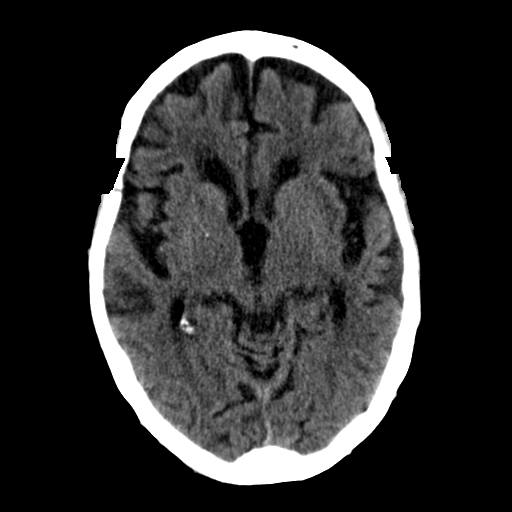
[im 15/32  brain]
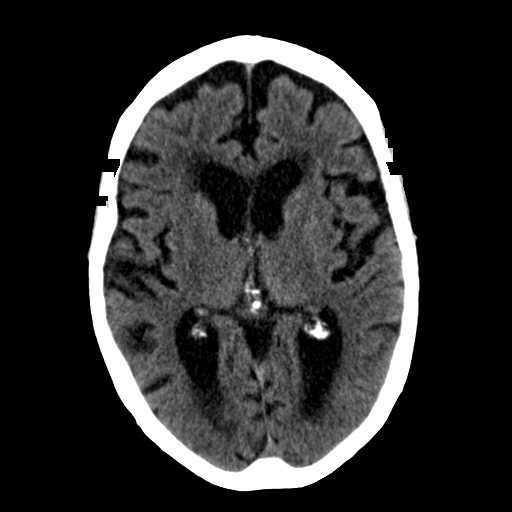
[im 17/32  brain]
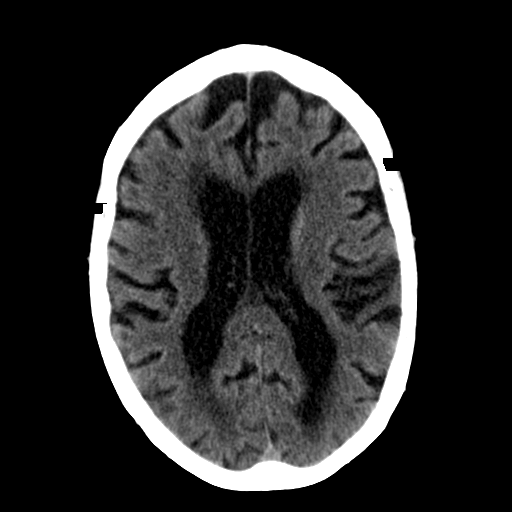
[im 17/32  bone]
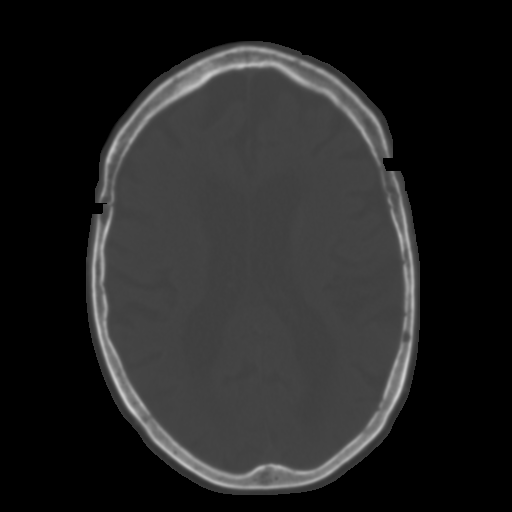
[im 19/32  brain]
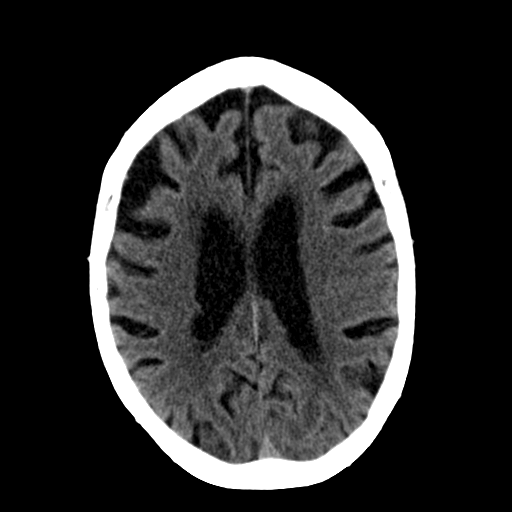
[im 21/32  brain]
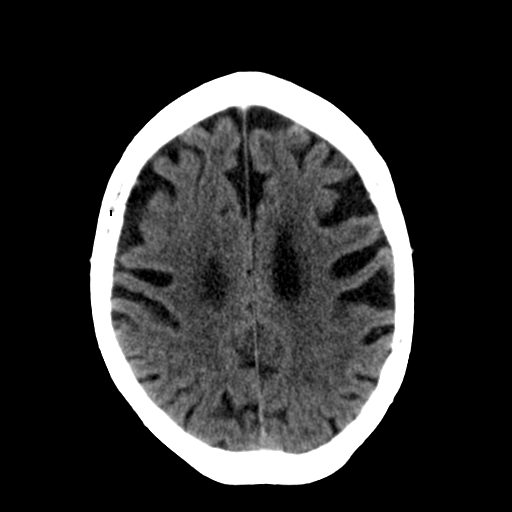
[im 23/32  brain]
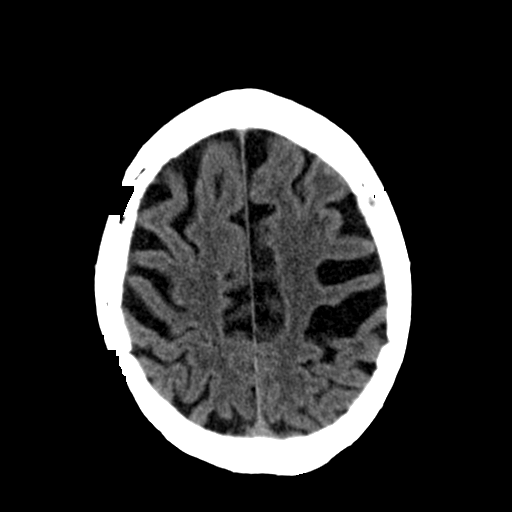
[im 24/32  brain]
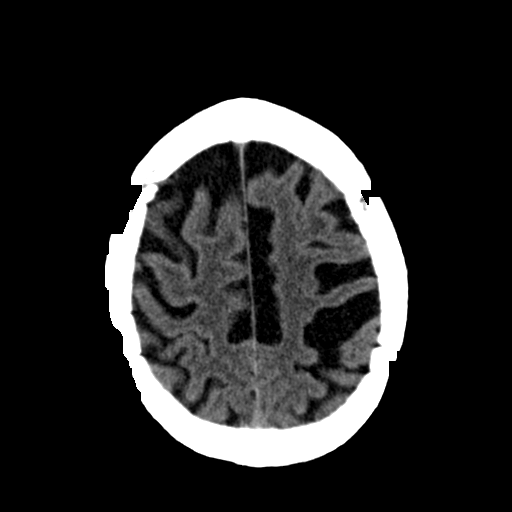
[im 24/32  bone]
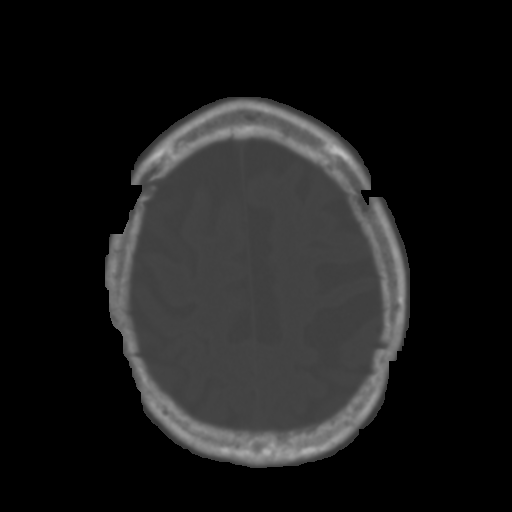
[im 26/32  brain]
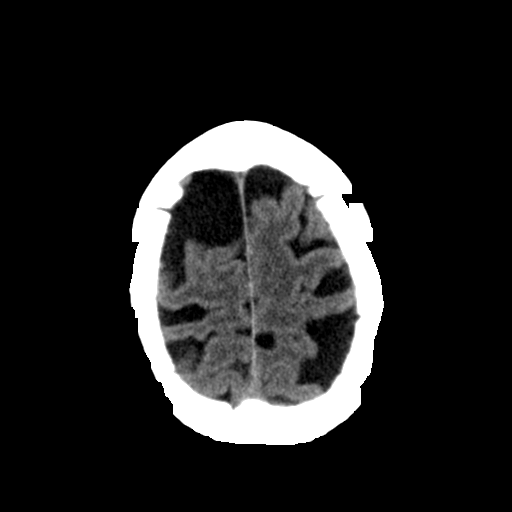
[im 28/32  brain]
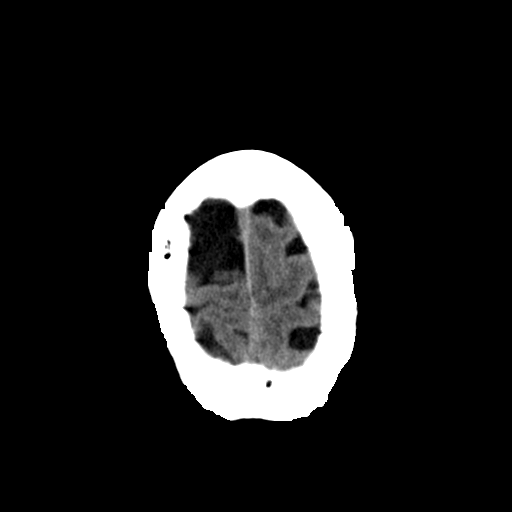
[im 30/32  brain]
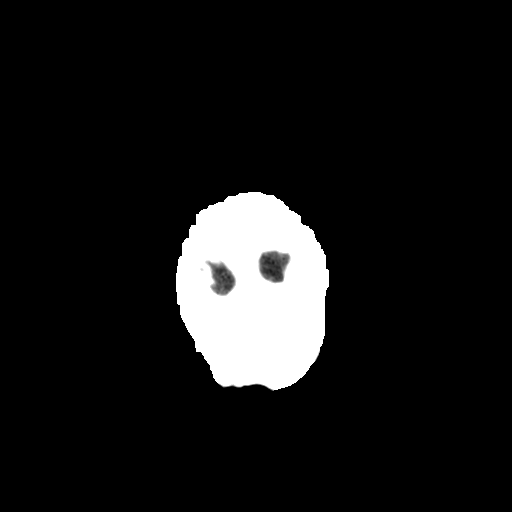

[16 of 30 positions shown; findings below may reference images not displayed]

FINDINGS: Generalized atrophy.

Normal ventricular morphology.

No midline shift or mass effect.

Small vessel chronic ischemic changes of deep cerebral white matter.

No definite intracranial hemorrhage, mass lesion, or evidence of
acute infarction.

No extra-axial fluid collections.

Bones and sinuses unremarkable.

Atherosclerotic calcifications at skullbase.
IMPRESSION: Atrophy with small vessel chronic ischemic changes in deep cerebral
white matter.

No acute intracranial abnormalities.

## 2014-11-03 NOTE — H&P (Signed)
PATIENT NAME:  Rose Stout, Rose Stout MR#:  [id number] DATE OF BIRTH:  1918/05/18  DATE OF ADMISSION:  05/19/2013  PRIMARY CARE PHYSICIAN: Beverely RisenFozia Khan, M.D.   CHIEF COMPLAINT: Confusion.   HISTORY OF PRESENT ILLNESS: This is a 81 year old female who has recently had 2 falls, one on Sunday where she developed some low back pain after the fall, and another fall 2 days ago where she hit her head. The last 2 nights she has been very confused. One night she did sleep in the recliner. She does complain of back pain. No pain when not moving around, but when trying to move around, she does get severe pain.   She was recently started on Lasix 20 mg daily. In the ER, she was found to have a sodium of 116. Creatinine was elevated from where she previously was at 1.06 which makes her GFR on this hospitalization 45, and she also had a compression fracture at L1, which is new since previous x-ray. Hospitalist services were contacted for further evaluation.   PAST MEDICAL HISTORY: COPD on 2.5 liters nasal cannula at home. She does not wear oxygen at night. Hypothyroidism, osteoporosis, peptic ulcer disease, hyponatremic episode in the past, edema of the lower extremities.   PAST SURGICAL HISTORY: Hysterectomy, ulcer surgery.  ALLERGIES: No known drug allergies.   MEDICATIONS: Include Tylenol 325 mg two tablets every 4 hours as needed, Advair Diskus 250/50 one inhalation twice a day, DuoNeb 3 mL four times a day, allopurinol 100 mg daily, aspirin 81 mg daily, calcium and vitamin D 600 mg 200 international units one tablet daily, flaxseed oil 1 capsule daily, Lasix 20 mg daily, loratadine 10 mg in the morning, Megace 10 mL daily, Singulair 10 mg at bedtime, Proventil Stout.r.n., Requip 0.25 mg at bedtime, Spiriva 1 inhalation daily, Synthroid 100 mcg daily, vitamin B12 2500 mcg sublingually daily.   FAMILY HISTORY: Father with COPD. Mother had COPD and hypothyroidism.   SOCIAL HISTORY: [facility]. Quit smoking in 1997. No alcohol. No drug use.   REVIEW OF SYSTEMS: CONSTITUTIONAL: Positive for fluid weight gain. No fever, chills, or sweats. Positive for weakness.  EYES: She does wear glasses.  ENT: Positive for decreased hearing. No sore throat. No difficulty swallowing.  CARDIOVASCULAR: No chest pain. No palpitations.  RESPIRATORY: No shortness of breath. No coughing. No sputum. No hemoptysis.  GASTROINTESTINAL: No nausea. No vomiting. Decreased appetite. No abdominal pain. No diarrhea. No constipation. No bright red blood per rectum. No melena.  GENITOURINARY: No burning on urination or hematuria.  MUSCULOSKELETAL: Positive for low back pain.  INTEGUMENT: No ulcers or lesions.  NEUROLOGIC: Positive for altered mental status.  PSYCHIATRIC: No anxiety or depression.  ENDOCRINE: Positive for hypothyroidism.  HEMOLYMPHATIC: No anemia.   PHYSICAL EXAMINATION:  VITAL SIGNS: Temperature 97.9, pulse 105, respirations 18, blood pressure 105/59, pulse oximetry 94% on room air.  GENERAL: No respiratory distress.  EYES: Conjunctivae and lids normal. Pupils equal, round, and reactive to light. Extraocular muscles intact. No nystagmus.  ENT: Nasal mucosa: No erythema.  THROAT: No erythema, no exudate seen. Lips and gums: No lesions.  NECK: No JVD. No bruits. No lymphadenopathy. No thyromegaly. No thyroid nodules palpated.  LUNGS: Clear to auscultation. No use of accessory muscles to breathe. No rhonchi, rales, or wheeze heard.  CARDIOVASCULAR: S1, S2 normal. No gallops, rubs or murmurs heard. Carotid upstroke 2+ bilaterally. No bruits.  EXTREMITIES: Dorsalis pedis pulses 2+ bilaterally, 3+ edema bilateral lower extremities.  ABDOMEN: Soft, nontender. No organomegaly/splenomegaly. Normoactive bowel sounds. No masses felt.  LYMPHATIC: No lymph nodes in the neck.  MUSCULOSKELETAL: Shows 3+ edema. No clubbing. No cyanosis.  SKIN: Chronic lower extremity discoloration.   NEUROLOGIC: Cranial nerves II through XII grossly intact. Deep tendon reflexes 1+ bilateral lower extremity. The patient is able to straight-leg raise bilaterally but with pain in the lower back.  PSYCHIATRIC: Alert, oriented to person, place.   LABORATORY AND RADIOLOGICAL DATA: A CT scan of the head shows no acute cranial abnormality, lumbar compression fracture, L1, loss of height about 25%.   CHEST X-RAY: No acute cardiopulmonary disease. Fracture of T3 seen previously. It looks like it has progressed from the last time.   LABORATORIES: Glucose 99, BUN 30, creatinine 1.06, sodium 116, potassium 4.8, chloride 80, CO2 of 33, calcium 9.4. Liver function tests are normal range. White blood cell count 10.1, H and H 14.4 and 41.4, platelet count of 304. Magnesium 1.9.   EKG: Normal sinus rhythm, biatrial enlargement, left axis deviation, pulmonary disease pattern.   ASSESSMENT AND PLAN:  1.  Severe hyponatremia. Will give gentle IV fluid hydration. Stop the Lasix. Fluid restrict and get serial sodiums.  2.  New compression fracture of lumbar spine. Progression of fracture of T3. Will get an orthopedic consultation and physical therapy evaluation. We will continue to monitor closely.  3.  Acute renal failure compared to previous creatinine. Will give gentle IV fluid hydration and check a creatinine.  4.  Hypothyroidism. Check a TSH in the a.m.  5.  Edema of the lower extremities. We will give compression stockings.  6.  Chronic respiratory failure with chronic obstructive pulmonary disease on home oxygen. Respiratory status stable at this point in time.  TIME SPENT ON ADMISSION: 55 minutes.   ____________________________ Herschell Dimes. Renae Gloss, MD rjw:np D: 05/19/2013 18:17:27 ET T: 05/19/2013 18:39:18 ET JOB#: [id number]  cc: Herschell Dimes. Renae Gloss, MD, <Dictator> Lyndon Code, MD Salley Scarlet MD ELECTRONICALLY SIGNED 05/20/2013 18:58

## 2014-11-03 NOTE — Consult Note (Signed)
Brief Consult Note: Diagnosis: Acute fracture of L1.   Patient was seen by consultant.   Consult note dictated.   Recommend further assessment or treatment.   Orders entered.   Comments: Patient with fall last Sunday.  Has had lumbar back pain.  MRI confirmed an acute fracture of L1.  She is tender over L1 on exam today as well.  Patient has no neuorologic symptoms of lower extremity weakness or paresthesias.  I am recommending a LS corset an PT.  If patient's condition does improve over the weekend Dr. Rosita KeaMenz will need to be contacted for possible kyphoplasty.  Electronic Signatures: Juanell FairlyKrasinski, Rex Magee (MD)  (Signed [id number]-Nov-14 17:44)  Authored: Brief Consult Note   Last Updated: 07-Nov-14 17:44 by Juanell FairlyKrasinski, Quan Cybulski (MD)

## 2014-11-03 NOTE — Discharge Summary (Signed)
PATIENT NAME:  Rose MingleKINS, Adriahna P MR#:  [id number] DATE OF BIRTH:  [birth year]  DATE OF ADMISSION:  05/19/2013 DATE OF DISCHARGE:  05/23/2013  PRIMARY CARE PHYSICIAN: Dr. Beverely RisenFozia Khan,  DISCHARGE DIAGNOSES: 1. Severe hyponatremia.  2. Acute renal failure. 3. New compression fracture of the lumbar spine.  4. Chronic respiratory failure with chronic obstructive pulmonary disease on home oxygen.   CONDITION: Stable.   CODE STATUS: DO NOT RESUSCITATE.   HOME MEDICATIONS: Please [facility] the [facility] physician discharge instruction medication reconciliation list. The patient needs home oxygen 2 liters by nasal cannula.   DIET: Regular diet.   ACTIVITY: As tolerated.  FOLLOW-UP CARE: Follow with PCP within 1 to 2 weeks. Follow up with Dr. Martha ClanKrasinski within 1 to 2 weeks.   The patient needs to continue physical therapy and LS corset according to Dr. Martha ClanKrasinski. Now hold the Lasix due to hyponatremia.   REASON FOR ADMISSION: Confusion.   HOSPITAL COURSE: A 81 year old Caucasian female with a history of recent fall, developed some low back pain after fall. The patient became confused and was sent to the ED for further evaluation. The patient recently started Lasix 20 mg p.o. daily. She was noted to have a low sodium at 116 in the ED, and creatinine was elevated from her where she was previously was at 1.06. For detailed history and physical examination, please refer to the admission note dictated by Dr. Renae GlossWieting. On admission date, her laboratory showed BUN 30, creatinine 1.06, glucose 99, sodium 116, potassium 4.8, chloride 80, bicarbonate 33. WBC 10.1, hemoglobin 14.4.  1. Severe hyponatremia. After admission, the patient has been on fluid restriction and Lasix was discontinued. In addition, the patient was treated with normal saline IV and salt supplement. The patient's sodium has been gradually improving, sodium increased to 128 today. 2. New compression fracture of the lumbar spine. Dr. Martha ClanKrasinski  evaluated the patient and suggested that if the patient's back pain does not improve may need need kyphoplasty. However, the patient has no more back pain. The patient is on physical therapy and LS corset.  3. For acute renal failure. The patient was treated with normal saline. Acute renal failure has resolved.  4. The patient also has bilateral feet edema, was on compression stockings,  now is better.  5. Chronic respiratory with chronic obstructive pulmonary disease on home oxygen, stable. The patient is clinically stable. The patient is alert, awake, but mildly demented. The patient's vital signs are stable. Physical examination is unremarkable. According to physical therapy evaluation, the patient needs a skilled nursing facility placement. The patient is stable. Will be discharged to a skilled nursing facility today.   I discussed the patient's condition and plan of treatment with the patient's son, nurse, case Production designer, theatre/television/filmmanager and Child psychotherapistsocial worker.   TIME SPENT: About 39 minutes.   ____________________________ Shaune PollackQing Angeligue Bowne, MD qc:sg D: 05/23/2013 11:39:00 ET T: 05/23/2013 12:25:10 ET JOB#: [id number]  cc: Shaune PollackQing Cavon Nicolls, MD, <Dictator> Shaune PollackQING Orvil Faraone MD ELECTRONICALLY SIGNED 05/23/2013 16:57

## 2014-11-03 NOTE — Consult Note (Signed)
Brief Consult Note: Diagnosis: Left intertrochanteric femur fracture.   Patient was seen by consultant.   Comments: Discussed findings with patient and her son. Recommend ORIF of intertrochanteric femur fracture.  The risks and benefits of surgical intervention were discussed in detail with the patient and her son. They expressed understanding of the risks and benefits and agreed with plans for surgery.   The potential risks and benefits of blood transfusion have been discussed with the patient.The patient expressed understanding of the risks and benefits and has signed the appropriate consent for blood transfusion.   Surgical site signed as per "right site surgery" protocol.  Electronic Signatures: Donato HeinzHooten, James P (MD)  (Signed 22-Dec-14 19:54)  Authored: Brief Consult Note   Last Updated: 22-Dec-14 19:54 by Donato HeinzHooten, James P (MD)

## 2014-11-03 NOTE — Consult Note (Signed)
PATIENT NAME:  Rose Stout, Rose Stout MR#:  [id number] DATE OF BIRTH:  [birth year]  DATE OF CONSULTATION:  05/20/2013  REFERRING PHYSICIAN:   CONSULTING PHYSICIAN:  Kathreen DevoidKevin L. Nalea Salce, MD  REASON FOR CONSULTATION:  L1 compression fracture.   HISTORY OF PRESENT ILLNESS:  Ms. Rose Stout is a 81 year old female who had a fall on Sunday when she went to get her newspaper. She then had another fall approximately 2 to 3 days ago at a nursing facility. She reportedly hit her head during this fall. The patient has been complaining of low back pain since her initial injury. The patient was evaluated at the bedside this evening with her son at the bedside. The son provided the majority of the history.   The patient states that her low back currently is feeling well. She is sitting semi-upright in her hospital bed.   PAST MEDICAL HISTORY:  Includes:  COPD, hypothyroidism, osteoporosis. peptic ulcer disease, hyponatremia and edema of the lower extremities.   PAST SURGICAL HISTORY:  Includes hysterectomy and surgery for ulcers.   MEDICATIONS:  Included:  Tylenol, Advair Diskus, DuoNeb, allopurinol, aspirin, calcium with vitamin D, flaxseed oil, Lasix, ranitidine, Megace, Singulair, Proventil, Requip, Spiriva, Synthroid and vitamin B12.   ALLERGIES:  No known drug allergies.   PHYSICAL EXAMINATION:  GENERAL APPEARANCE:  The patient is alert and sitting upright in bed. She is in no acute distress. Her son is at the bedside. Examination the lower extremities demonstrate significant 2+ edema in the ankles and feet. There is a slight pinkish discoloration to both feet. She has intact sensation to light touch throughout both lower extremities, including the feet. She had faintly palpable dorsalis pedis pulse. The posterior tibialis pulse could not be palpated due to the edema. She had no calf tenderness. The patient could actively flex and extend all five toes on both feet and could dorsiflex and plantar flex her ankles  bilaterally. She was able to extend her knees actively as well. She had no pain with logrolling of either hip. She had no tenderness. The patient had tenderness particularly over levels of L1 to L3. Her skin is intact overlying her back and there was no obvious deformity to the spine.   RADIOLOGY:  I reviewed the patient's plain films as well as her MRI, which was done this afternoon. This shows an acute fracture of L1. By plain film, the estimated compression of the vertebral disk height was estimated at 25% and on the MRI it is between 25% and 30%. There is no retropulsion of any vertebral fragments. There is no abnormal curvature or kyphosis to the spine. No other osseous abnormality is noted other than diffuse degenerative changes in the lumbar spine.   ASSESSMENT:  Acute L1 fracture status post fall.   PLAN:  Ms. Rose Stout will be ordered for a lumbosacral corset. She will have her pain managed symptomatically. I recommend Physical Therapy evaluation. I explained to the patient and her son that if her condition did not improve over the weekend with the current treatment plan, then Dr. Rosita KeaMenz, who was already aware of this patient, will be consulted to evaluate the patient for a possible kyphoplasty. The patient and her son understood and agreed with this plan.   ____________________________ Kathreen DevoidKevin L. Pierce Biagini, MD klk:jm D: 05/20/2013 17:41:25 ET T: 05/20/2013 18:07:35 ET JOB#: [id number]  cc: Kathreen DevoidKevin L. Pamelyn Bancroft, MD, <Dictator> Kathreen DevoidKEVIN L Hollan Philipp MD ELECTRONICALLY SIGNED 06/06/2013 19:23

## 2014-11-03 NOTE — Consult Note (Signed)
PATIENT NAME:  Rose Stout, Rose Stout MR#:  [id number] DATE OF BIRTH:  [birth year]  DATE OF CONSULTATION:  07/04/2013  REFERRING PHYSICIAN: Orie Fisherman, M.D.  CONSULTING PHYSICIAN:  Illene Labrador. Angie Fava., MD  CHIEF COMPLAINT: Left hip pain.   HISTORY OF PRESENT ILLNESS: The patient is a 81 year old female who apparently sustained a mechanical fall and landed on her left hip and side. She was unable to stand or bear weight due to the pain. There was no apparent loss of consciousness. She denied any other injuries other than the left hip pain.   PAST MEDICAL HISTORY: COPD, hyponatremia, osteoporosis, peptic ulcer disease, hypothyroidism, status post hysterectomy, appendectomy, cholecystectomy.   ALLERGIES: CIPRO.   MEDICATIONS AT THE TIME OF ADMISSION: Vitamin B12 one daily, Synthroid 100 mcg daily, Spiriva 18 mcg inhalation 1 inhalation in the morning, ropinirole 0.25 mg at bedtime, quetiapine 25 mg at bedtime, montelukast 10 mg at bedtime, megestrol 40 mg/mL, 10 mL in the morning. Loratadine 10 mg daily, flaxseed oil 1 capsule daily, multivitamin daily, calcium 600 mg plus D 3 times a day, aspirin 81 mg daily, allopurinol 100 mg daily, albuterol ipratropium 2.5/0.5 mg per 3 mL inhalation solution at bedtime as needed for shortness of breath, Advair Diskus 250/50 one puff b.i.d.   SOCIAL HISTORY: The patient lives at [facility].  She has a previous history of smoking but denies any current tobacco or alcohol use. Her son is present.   FAMILY HISTORY: Positive for COPD and hypothyroidism.   REVIEW OF SYSTEMS: Positive for back pain (L1 compression fracture in November). She denies any gross numbness or paresthesias. She denies any gross constitutional symptoms. She has been using a walker for ambulation. Other pertinent musculoskeletal review of systems is unremarkable.   PHYSICAL EXAMINATION:  GENERAL: An elderly frail-appearing female seen in some discomfort on stretcher.  HEENT:  Atraumatic, normocephalic. Sclerae are clear. Extraocular motion is intact.  NECK: Supple, nontender, and with good range of motion.  LUNGS: Expiratory rales. No appreciable wheezing.  CARDIAC: Regular rate and rhythm with normal S1, S2. No appreciable murmurs, gallops or rubs. Pedal pulses are palpable bilaterally.  ABDOMEN: Soft, nontender.  MUSCULOSKELETAL: The pertinent musculoskeletal exam is noted for the left lower extremity. The left lower extremity is shortened and externally rotated. Pain is elicited with any attempt to range of motion. No gross swelling or ecchymosis to the left hip. No knee effusion. No tenderness to palpation about the knee or ankle. Mild pretibial or ankle edema is noted.  NEUROLOGIC: Awake, alert and oriented. Sensory function is grossly intact. Motor strength is felt to be 4 to 5 over 5 with the exception of the left lower extremity which was not assessed due to the injury. Good fine motor control. No clonus or tremor.   X-RAYS: Radiographs of the left hip demonstrate a left intertrochanteric femur fracture with displacement. Diffuse osteopenia is appreciated.   IMPRESSION: Left intertrochanteric femur fracture.   PLAN: The findings were discussed in detail with the patient and her son. The recommendation was made for ORIF of left intertrochanteric femur fracture. The usual risks and benefits of surgical intervention were discussed. They expressed understanding of the risks and benefits and agreed with plans for surgical intervention.   The left hip was signed as per right-site surgery protocol.    ____________________________ Illene Labrador. Angie Fava., MD jph:np D: 07/04/2013 20:14:00 ET T: 07/04/2013 21:56:22 ET JOB#: [id number]  cc: Illene Labrador. Angie Fava., MD, <Dictator> JAMES P Angie Fava MD  ELECTRONICALLY SIGNED 07/06/2013 7:26

## 2014-11-03 NOTE — Discharge Summary (Signed)
PATIENT NAME:  Rose Stout, Rose Stout MR#:  [id number] DATE OF BIRTH:  1918/06/27  DATE OF ADMISSION:  03/12/2013 DATE OF DISCHARGE:  03/17/2013  ADMITTING DIAGNOSES: Weakness, shortness of breath.   DISCHARGE DIAGNOSES:  1. Weakness due to dehydration, hyponatremia.  2. Hypokalemia, hypomagnesemia due to dehydration.  3. Acute delirium, likely related to age, being dehydrated, urinary tract infection, now resolved.  4. Acute on chronic obstructive pulmonary disease exacerbation, now improved.  5. Hypothyroidism.  6. Gout.  7. Weakness. 8. Poor appetite.  9. Hypothyroidism.  10. Osteoporosis.  11. History of peptic ulcer disease.   CONSULTANTS:  1. Physical therapy.  2. Rose Lizabeth LeydenN. Lateef, MD  LABORATORY DATA: Glucose on admission 171, BUN 13, creatinine 0.62, sodium was 115, potassium 5.4, chloride 79, CO2 was 26, magnesium was 1.7. LFTs showed albumin of 2.7, bilirubin total 0.3, total protein 5.3. WBC 10.1, hemoglobin 12.7, platelet count was 297. Urinalysis was negative. Cortisol level was 24.3.   IMAGING: CT of the chest, abdomen and pelvis shows some duodenal inflammation and no other significant abnormalities noted.   HOSPITAL COURSE: Please refer to H and Stout done by the admitting physician. The patient is a 81 year old female who lives independently. Came in with generalized weakness and shortness of breath. The patient, in terms of her generalized weakness, noted to have severe hyponatremia. Also had other electrolyte abnormalities. She was worked up for possible underlying malignancy and was given IV fluids. Followed by nephrology. Her sodium is significantly improved. She had other electrolyte imbalances, including hyperkalemia, subsequently became hypokalemic. Her potassium is close to normal. She was also noted to have acute chronic obstructive pulmonary disease exacerbation on admission that resolved with nebulizers and inhalers. The patient is doing much better and is stable for  discharge back to home.   DISCHARGE MEDICATIONS:  1. Delsym 30 mg per 5 mL q.12 in the morning and at bedtime.  2. Aspirin 81 mg 1 tab Stout.o. daily.  3. Loratadine 10 daily.  4. Calcium plus vitamin D 1 tab Stout.o. t.i.d. 5. Centrum Silver 1 tab Stout.o. daily.  6. Albuterol/ipratropium inhalation as needed. 7. Synthroid 100 mcg daily. 8. Advair 250/50 one puff b.i.d. 9. Spiriva 18 mcg daily.  10. Allopurinol 100 daily. 11. Ropinirole 0.25 at bedtime. 12. Flaxseed oil daily.  13. Vitamin B12 2500 mcg sublingual as taking previously. 14. Singular 10 daily.  15. Acetaminophen 650 q.4 Stout.r.n. 16. Megace 10 mL daily. 17. Quetiapine 12.5 mg daily. 18. Proventil 2 puffs 4 times a day as needed.   DIET: Low sodium, low fat, low cholesterol diet. Ensure Plus 3 times a day.   ACTIVITY: As tolerated.   HOME HEALTH: Physical therapy and nurse visit at home.   FOLLOWUP: With primary MD in 1 to 2 weeks.  TIME SPENT: Note, 35 minutes spent.   ____________________________ Lacie ScottsShreyang H. Allena KatzPatel, MD shp:OSi D: 03/18/2013 08:37:13 ET T: 03/18/2013 09:42:02 ET JOB#: [id number]  cc: Zarayah Lanting H. Allena KatzPatel, MD, <Dictator> Charise CarwinSHREYANG H Maximiano Lott MD ELECTRONICALLY SIGNED 03/26/2013 14:14

## 2014-11-03 NOTE — H&P (Signed)
PATIENT NAME:  Rose Stout, [name] MR#:  [id number] DATE OF BIRTH:  1917-10-24  DATE OF ADMISSION:  03/12/2013  REFERRING PHYSICIAN:  Dr. Lucrezia EuropeWebster Allison.   PRIMARY CARE PHYSICIAN: Dr. Beverely RisenFozia Khan.   CHIEF COMPLAINT: Weakness, shortness of breath.   HISTORY OF PRESENT ILLNESS: This is a 81 year old female who [facility] [place], who presents with above-mentioned complaints. The patient's was brought by her son, who helps take care of her, due to complaints of shortness of breath for the last couple of days, where she had upper respiratory illness prior to that. The patient had significant wheezing upon presentation to ED which improved with nebulizer treatment, as well she had weakness for the last few days as well. Son reports that she had decreased p.o. intake for the last few days as well and she has not been feeling well. The patient's chest x-ray did not show any opacity or infiltrate, and she had basic blood work done which did show significant hyponatremia at 115;  last value was before two years where it was at 138. As well, she had hyperkalemia at 5.4. As well, she was hypotensive with systolic blood pressure in the 80s, which did improve after receiving a normal saline bolus. The patient denies any chest pain, reports she has been feeling weak recently and had decreased p.o. intake, appetite, but reports she has been drinking enough water. Denies any fever, chills, chest pain or discomfort. Has complains of cough mainly upon lying supine, but no productive sputum. As well, had been complaining of some wheezing. Hospitalist service were requested to admit the patient for further management and treatment of her COPD exacerbation and hyponatremia.  The patient was given p.o. Kayexalate in ED.   PAST MEDICAL HISTORY:  1.  Chronic obstructive pulmonary disease  on 2.5 liters nasal cannula at home. 2.  Hypothyroidism.  3.  Osteoporosis.  4.  History of peptic  ulcer disease in 1950s.   ALLERGIES:  NO DRUG ALLERGIES.    FAMILY HISTORY: Significant for hypertension.  SOCIAL HISTORY:  History of tobacco use; quit in 1997. No alcohol use. [facility] [place].   HOME MEDICATIONS: 1.  Advair 250/50 1 puff b.i.d.  2.  Aspirin 81 mg oral daily.  3.  Calcium with vitamin D.  4.  Multivitamin 1 tablet daily.  5.  Singulair 10 mg by mouth daily.  6.  Spiriva HandiHaler daily.  7.  Synthroid 100 mcg oral daily.  8.  Vitamin B12 2400 mcg sublingual. 9.  Century oral tablet 1 tablet oral daily.  10.  Flaxseed oil 1 tablet oral daily.  11.  Ropinirole 0.25 mg oral daily.  12.  Loratadine 10 mg oral daily 14.  The son reports today she took 50 mg of p.o. prednisone as prescribed by her primary care, today was the first dose, but, otherwise. she is not on chronic prednisone use.   REVIEW OF SYSTEMS:  CONSTITUTIONAL: No fever, no chills, positive for weakness, fatigue. No weight gain. No weight loss.  EYES: No blurry vision, double vision, inflammation, glaucoma.  ENT: Denies tinnitus, ear pain, epistaxis or discharge. RESPIRATORY: Complaining of cough, shortness of breath and has history of COPD as well as complains of wheezing.  CARDIOVASCULAR: Denies chest pain, edema, arrhythmia, palpitations or syncope.  GASTROINTESTINAL: Denies nausea, vomiting, diarrhea, abdominal pain, constipation or diarrhea.  GENITOURINARY: Denies dysuria, hematuria or renal colic.  ENDOCRINE: Denies polydipsia, heat  or cold intolerance.  HEMATOLOGY: Denies anemia, easy bruising, bleeding diathesis.  INTEGUMENT: Denies acne, rash or skin lesions.  MUSCULOSKELETAL: Denies any gout, cramps. Has history of arthritis. Ambulates with a cane and for long distances by chair.  GENITOURINARY: Denies CVA, TIA, headache, tremors.  PSYCHIATRIC: Denies anxiety, insomnia, depression or schizophrenia.   PHYSICAL EXAMINATION: VITAL SIGNS:  Temperature 97.6, pulse 97, respiratory rate 32, blood pressure 92/49, saturating 96% on oxygen.  GENERAL: Frail, cachectic, elderly female in bed, looks comfortable in bed, in no apparent distress.  HEENT: Head atraumatic, normocephalic. Pupils equal, reactive to light. Pink conjunctivae. Anicteric sclerae. Dry oral mucosa.  NECK: Supple. No thyromegaly. No JVD. No carotid bruits.  CHEST: Had air entry bilaterally with diffuse rales, rhonchi and has scattered wheezing.  CARDIOVASCULAR: S1, S2 heard. No rubs, murmurs or gallops.  ABDOMEN: Soft, nontender, nondistended. Bowel sounds present.  EXTREMITIES: Mild pitting edema. No clubbing. No cyanosis. Dorsalis pedis pulse felt bilaterally.  SKIN: It is warm and dry. Has a few senile purpuras.  PSYCHIATRIC: The patient is awake, alert, oriented x 3, appears to be anxious, wants to go back home.  NEUROLOGICAL: Grossly intact. Motor appears to be moving all extremities without significant deficits.  LYMPHATICS: No cervical or supraclavicular lymphadenopathy.   PERTINENT LABORATORY DATA: Glucose 171, BUN 13, creatinine 0.62, sodium 115, potassium 5.4, chloride 79. Troponin less than 0.02. White blood cell 5.6, hemoglobin 15.1, hematocrit 38, platelets 283, pCO2 49, pH 7.33 on VBG  Chest x-ray showing chronic interstitial changes and typical COPD disease of left lung,  but no opacity or infiltrate. Still awaiting official reading by radiology.   EKG showing sinus tachycardia at 112 beats per minute without significant ST or T wave changes.   ASSESSMENT AND PLAN: 1.  Hyponatremia. This is most hypovolemic hyponatremia, most likely related to decreased p.o. intake due to her chronic obstructive pulmonary disease exacerbation, but the patient will continue on normal saline, currently receiving 1 liter bolus at 100 mL per hour. Will recheck BMP in a.m. as well try to avoid over-correction of her hyponatremia. We will check TSH level, will check urinalysis  and will check urine sodium. We will consult nephrology service. Given the fact that the patient was hypotensive, tachycardic and had hypokalemia, upon presentation,  as we will check random cortisol level.  2.   Weakness, most likely due to her hyponatremia. As well, we will check urinalysis to rule out urinary tract infection.  3.  Chronic obstructive pulmonary disease exacerbation. We will have the patient on Solu-Medrol, she is on oxygen. We will have her resume her Spiriva  nebulizer, Advair and Singulair as well.  4.   Hyperkalemia. Given Kayexalate, will recheck level in a.m.  5.   Hypothyroidism. Continue with Synthroid, check TSH.  6.   Osteoporosis. Continue with calcium and vitamin D.  7.  Deep vein thrombosis prophylaxis. Subcutaneous heparin.  8.  CODE STATUS: Discussed with the son who reports she has a LIVING WILL. He is her  health care power of attorney and reports she is DNR/DNI.   TOTAL TIME SPENT ON ADMISSION AND PATIENT CARE:  55 minutes.    ____________________________ Starleen Arms, MD dse:nts D: 03/12/2013 02:05:31 ET T: 03/12/2013 02:28:53 ET JOB#: [id number]  cc: Starleen Arms, MD, <Dictator> DAWOOD Teena Irani MD ELECTRONICALLY SIGNED 03/12/2013 7:13

## 2014-11-03 NOTE — H&P (Addendum)
PATIENT NAMMarland Stout:  Rose Stout, Rose Stout [id number] OF BIRTH:  [birth year] OF ADMISSION:  07/04/2013 CARE PHYSICIAN: Beverely RisenFozia Khan, MD COMPLAINT: Fall with left hip pain.  OF PRESENT ILLNESS: A 81 year old Caucasian female patient with history of recurrent falls, chronic hyponatremia, who is presently in rehab getting physical therapy after she was admitted last month for low back pain.  She returns after she tripped on a quilt and fell down. The patient has been walking with a walker well as per the son, has not had any falls till today since her last admission. Presently, the patient complains of pain in the left hip. Also had some chest pain earlier which was extremely pleuritic but seems to have resolved with the pain medication. A CT scan of the chest was done which only showed COPD, nothing acute other than some nodules which she has had previously.  does not complain of any shortness of breath, nausea or vomiting. Has had good appetite. Son mentioned that she drinks a lot of Coke which he is trying to replace with water and juice. She has sodium of 130 today and has chronic hyponatremia.  patient did have a low blood pressure into the 80s after a dose of morphine in the Emergency Room, presently blood pressure is better at 105/60. Saturations are around 88% to 92%.  MEDICAL HISTORY: COPD, on home oxygen.  Hypothyroidism.  Dementia.  Osteoporosis.  Peptic ulcer disease.  Chronic hyponatremia.  Edema of lower extremities.  SURGICAL HISTORY: Hysterectomy.  Ulcer surgery.  No known drug allergies.  HISTORY: COPD in her mother along with hypothyroidism.  HISTORY: The patient lives at an assisted living facility.  MEDICATIONS: Include: 1.  Advair diskus 250/50 inhaled 2 times a day.  DuoNeb 3 mL inhaled once a day.  Allopurinol 100 mg oral once a day.  Aspirin 81 mg once a day.  Calcium vitamin D 1 tablet oral 3 times a day.  Centrum Silver once a day.  Flaxseed oil 1 capsule oral once a day.  Loratadine 10 mg oral once  a day. Megestrol 40 mg 10 mL oral once a day.  Montelukast 10 mg oral once a day.  Proventil HFA 2 puffs inhaled 4 times a day as needed.  Seroquel 25 mg oral once a day.  Ropinirole 0.25 mg oral once a day. Spiriva 18 mcg inhaled once a day.  Synthroid 100 mcg oral once a day.  Vitamin B12 1 tablet oral once a day.  OF SYSTEMS:Complains of fatigue and weakness. No blurred vision, pain, redness. NOSE, THROAT: No tinnitus, ear pain. Does have some hearing loss. Has chronic cough, COPD, chronic respiratory failure.  No chest pain.  No nausea, vomiting, diarrhea. No dysuria, hematuria, frequency. No polyuria, nocturia or thyroid problems. AND LYMPHATIC: No anemia or easy bruising.   No acne, rash, lesion.Has left hip pain, osteoporosis, osteoarthritis, back pain. No focal numbness, weakness.  DIAGNOSTIC AND RADIOLOGICAL STUDIES: Show:  Glucose 117, BUN 13, creatinine 0.80, sodium 130, potassium 4, chloride 97, bicarb 28. AST, ALT, alkaline phosphatase, bilirubin normal. Troponin less than 0.02. WBC 6.6, hemoglobin 12.8, platelets of 312.  O positive, blood group. INR 1.  Urinalysis shows no bacteria.  EKG shows normal sinus rhythm with Q waves in inferior leads. No acute ST-T wave changes.  CT scan of the chest shows pulmonary nodules and COPD, fibrotic disease. Severely kyphotic curvature of the thoracic spine.  Left hip complete x-ray shows displaced intertrochanteric fracture of the left hip with associated foreshortening and accentuated varus  angulation.  AND PLAN:  Left hip fracture needing total hip arthroplasty. Dr. Ernest Pine with orthopedics has been made aware of the consult. We will admit the patient onto the surgical floor. The patient will be kept n.Stout.o.  Deep vein thrombosis prophylaxis once surgery is done. The patient will be moderate to high risk for the surgery considering her age and comorbidities, which I have discussed with the son. Will watch her for any complications. Pain medications added. Will  add incentive spirometry. Watch out for any acute blood loss anemia.  Hypotension secondary to morphine.  Will reduce the dose on the pain medication.  Chronic obstructive pulmonary disease, has mild wheezing. We will continue her inhalers and DuoNeb Stout.r.n.  Hypothyroidism, on Synthroid.  Deep vein thrombosis prophylaxis per Dr. Ernest Pine.  CODE STATUS: DO NOT RESUSCITATE/DO NOT INTUBATE.  SPENT TODAY ON THIS CASE: 55 minutes.  West Bali, MD 07/04/2013 19:08:00 ET 07/04/2013 19:57:21 ET [id number]  Dmari Schubring R. Rose Durango, MD, <Dictator>M. Welton Flakes, MDP. Angie Fava., MD Orie Fisherman MDSIGNED 07/05/2013 15:35 Electronic Signatures for Addendum Section: [name] (MD)  (Signed Addendum 25-Dec-14 13:00) Pt examined in ED on day of admission. Physical Exam:  GEN no acute distress, cachectic, thin   HEENT pink conjunctivae, PERRL, hearing intact to voice, moist oral mucosa   NECK supple  No masses  thyroid not tender   RESP normal resp effort  clear BS  no use of accessory muscles   CARD regular rate  no murmur  No LE edema  no JVD   ABD denies tenderness  soft  normal BS  no Abdominal Bruits  no Adominal Mass   GU foley catheter in place  clear yellow urine draining   EXTR negative cyanosis/clubbing, negative edema. Left hip shortened and rotated out.   SKIN normal to palpation, No rashes, No ulcers   NEURO cranial nerves intact, follows commands, motor/sensory function intact, globally weak   PSYCH alert, poor insight, lethargic

## 2014-11-03 NOTE — Op Note (Signed)
PATIENT NAME:  Rose Stout, Rose Stout MR#:  [id number] DATE OF BIRTH:  1917/08/21  DATE OF PROCEDURE:  07/05/2013  PREOPERATIVE DIAGNOSIS:  Left intertrochanteric femur fracture.   POSTOPERATIVE DIAGNOSIS:  Left intertrochanteric femur fracture.   PROCEDURE PERFORMED:  Open reduction and internal fixation of left intertrochanteric femur fracture.   SURGEON:  Illene Labrador. Hooten, M.D.   ANESTHESIA:  Spinal.   ESTIMATED BLOOD LOSS:  200 mL.   FLUIDS REPLACED:  600 mL of crystalloid.   DRAINS:  None.   IMPLANTS UTILIZED:  Synthes 11 mm x 380 mm, 130 degree trochanteric fixation nail, 100 mm helical blade, and a 5.0 x 48 mm locking screw.   INDICATIONS FOR SURGERY:  The patient is a 81 year old female who fell and sustained a displaced left intertrochanteric femur fracture.  After discussion of the risks and benefits of surgical intervention with the patient and her son, they expressed understanding of the risks and benefits and agreed with plans for surgical intervention.   PROCEDURE IN DETAIL:  The patient was brought into the Operating Room and, after adequate spinal anesthesia was achieved, the patient was placed on the fracture table and traction was applied to the left lower extremity.  All bony prominences were well padded.  Provisional reduction of the left hip was performed and position was confirmed in both AP and lateral planes using C-arm.  The patient's left hip and leg were cleaned and prepped with alcohol and DuraPrep and draped in the usual sterile fashion.  A "timeout" was performed as per usual protocol.  A lateral longitudinal incision was made extending from the tip of the greater trochanter proximally.  Fascia was incised in line with the skin incision.  The fibers of the gluteus were split in line.  The tip of the greater trochanter was palpated and a distally threaded guide pin was inserted through the greater trochanter into the intramedullary canal.  Position was confirmed in both AP  and lateral planes using the C-arm.  A step drill was used to open the entry site.  A threaded guide rod was inserted and measurements were obtained it was felt that an 380 mm nail was appropriate.  An 11 mm x 380 mm, 130 degree trochanteric fixation nail was then advanced over the guidewire.  Position was confirmed in both AP and lateral planes.  Guidewire was removed.  A second stab incision was made and the tissue protector was inserted through the outrigger device and advanced to the lateral cortex of the femur.  A threaded guidewire was inserted into the femoral neck and head.  Good position was noted in both AP and lateral planes using the C-arm.  Cortex was opened with cortical reamer and then measurements were obtained.  It was felt that a 100 mm length helical blade was appropriate.  A cannulated reamer was inserted over the guidewire.  Next, a 100 mm helical blade was positioned and impacted into place.  Good purchase was appreciated.  The locking mechanism was engaged proximally.  Next, the C-arm was positioned so as to visualize the distal locking hole.  Stab incision was made and hole was drilled through the slotted position and a 5.0 x 48 mm locking screw was inserted.  Good position was noted in both AP and lateral planes.  Outrigger device was removed.  The wound was irrigated with copious amounts of normal saline with antibiotic solution and then suctioned dry.  Good hemostasis was appreciated.  Fascia was closed using interrupted sutures of #  1 Vicryl.  The subcutaneous tissue was approximated in layers using first #0 Vicryl followed by #2-0 Vicryl.  Her skin was closed with skin staples.  A sterile dressing was applied.    The patient tolerated the procedure well.  She was transported to the recovery room in stable condition.    ____________________________ Illene LabradorJames P. Angie FavaHooten Jr., MD jph:ea D: 07/05/2013 23:48:40 ET T: 07/06/2013 00:12:23 ET JOB#: [id number]  cc: Illene LabradorJames P. Angie FavaHooten Jr., MD,  <Dictator> JAMES P Angie FavaHOOTEN JR MD ELECTRONICALLY SIGNED 07/06/2013 7:21

## 2014-11-04 NOTE — Discharge Summary (Signed)
PATIENT NAME:  Rose Stout, Rose Stout MR#:  [id number] DATE OF BIRTH:  04/18/1918  DATE OF ADMISSION:  07/04/2013 DATE OF DISCHARGE:  07/12/2013  DISCHARGE DIAGNOSES: 1.  Fall with fractures left side, status post surgery.  2.  Hypothyroidism.  3.  Acute renal failure, resolved. 4.  Anemia of blood loss.  5.  Chronic obstructive pulmonary disease.  6.  Depression.  7.  Hyponatremia.   CONDITION ON DISCHARGE: Stable.   CODE STATUS:  DO NOT RESUSCITATE.  MEDICATIONS ON DISCHARGE: 1.  Advair Diskus 1 puff inhaled 2 times a day.  2.  Proventil 2 puffs inhaled 4 times a day as needed.  3.  Spiriva 18 mcg inhalation capsule once a day.  4.  Synthroid 100 mcg once a day in the morning.  5.  Calcium plus vitamin D 3 times a day.  6.  Allopurinol 100 mg once a day.  7.  Megestrol 40 mg per suspension, give 10 mL once a day.  8.  Quetiapine 25 mg oral once a day.  9.  Montelukast 10 mg once a day.  10.  Oxycodone 5 mg every 4 hours as needed for pain.  11.  Lovenox injection 30 mg subcu every 12 hours.  12.  Ferrous sulfate 325 mg 3 times a day.  13.  Docusate and senna combination 2 times a day as needed for constipation.  14.  Pantoprazole 40 mg delayed-release tablet 2 times a day.  15.  Guaifenesin 1 tablet 2 times a day as needed for nasal congestion.  16.  Ensure Plus oral 3 times a day.   DIET ON DISCHARGE: Regular with diet consistency regular.   TIMEFRAME TO FOLLOWUP: Within 1 to 2 weeks in ortho clinic and advised to have weekly sodium checkup at nursing home.   HISTORY OF PRESENTING ILLNESS: A 81 year old female with past history of recurrent falls, chronic hyponatremia and COPD, came to the hospital after having a fall and fracture in hip, admitted to medical service because of multiple medical issues. Blood pressure was in 80s on presentation and after admission orthopedic surgery is done and she remained in the hospital receiving physical therapy for a few days. Finally, we are  arranging for the nursing home rehab placement for her improvement.   HOSPITAL COURSE:   1.  Her hospital course also contained some of the medical issues as acute encephalopathy which was narcotic-induced and was resolved after Narcan and decreasing pain medication.  2.  COPD.  There was no acute exacerbation. We continued baseline medication and she remained stable.  3.  Hypothyroidism. We continued Synthroid.  4.  History of gout. No acute episode and we continued allopurinol. 5.  Restless leg syndrome. We continued Requip.  6.  Hyponatremia.  The patient had episodes of hyponatremia frequently in the past. She was taking Seroquel now so I stopped the Seroquel on discharge and we are discharging with sodium tablets to be taken at rehab and frequent checkup of that.   IMPORTANT LABORATORY RESULTS IN THE HOSPITAL: Sodium was 132 which came to 127 on the day of discharge and we are going to recheck it. Hemoglobin was 10.7, came up to 9.1 and remained stable. CT scan of the chest without contrast was done which showed some fibrotic changes and it also showed some pulmonary nodules, 1 cm in left lung apex and 4 mm nodules within right middle lobe and we advised to have surveillance CAT scans within 6 to 12 months.   TOTAL  TIME SPENT ON THIS DISCHARGE: 40 minutes.    ____________________________ Hope PigeonVaibhavkumar G. Elisabeth PigeonVachhani, MD vgv:cs D: 07/12/2013 14:31:00 ET T: 07/12/2013 14:48:34 ET JOB#: [id number]  cc: Hope PigeonVaibhavkumar G. Elisabeth PigeonVachhani, MD, <Dictator> Illene LabradorJames P. Angie FavaHooten Jr., MD Altamese DillingVAIBHAVKUMAR Carr Shartzer MD ELECTRONICALLY SIGNED 07/17/2013 22:07
